# Patient Record
Sex: Female | Born: 1985 | Race: Black or African American | Hispanic: No | Marital: Single | Smoking: Never smoker
Health system: Southern US, Community
[De-identification: ages and names within clinical notes are randomized; demographics above are authoritative.]

## PROBLEM LIST (undated history)

## (undated) DIAGNOSIS — IMO0002 Reserved for concepts with insufficient information to code with codable children: Secondary | ICD-10-CM

## (undated) DIAGNOSIS — R87619 Unspecified abnormal cytological findings in specimens from cervix uteri: Secondary | ICD-10-CM

## (undated) HISTORY — DX: Unspecified abnormal cytological findings in specimens from cervix uteri: R87.619

## (undated) HISTORY — DX: Reserved for concepts with insufficient information to code with codable children: IMO0002

---
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
## 2005-10-26 IMAGING — US US ABDOMEN COMPLETE
1 series · 14 of 25 positions shown · non-contrast
Comparison: none

CLINICAL DATA: Abdominal pain.
 ABDOMEN ULTRASOUND:
TECHNIQUE: Complete abdominal ultrasound examination was performed including evaluation of the liver, gallbladder, bile ducts, pancreas, kidneys, spleen, IVC, and abdominal aorta.
 No prior studies.

[Series 1: unknown · 0.28mm/px · 14 of 55 slices shown]
[im 1/55]
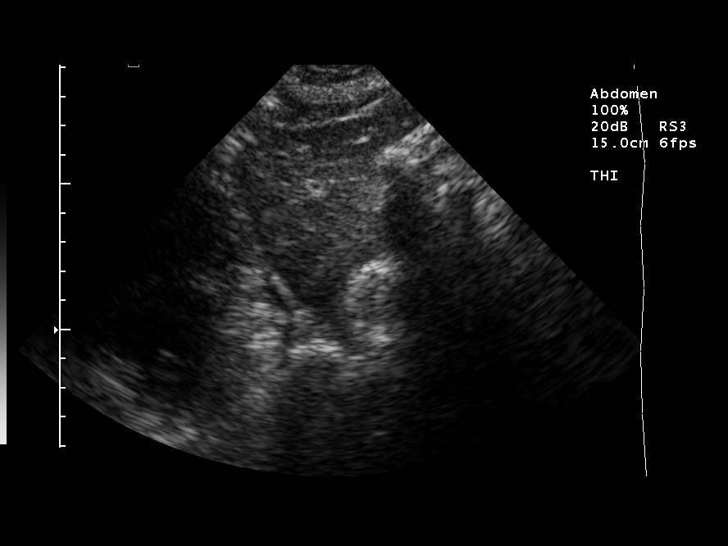
[im 5/55]
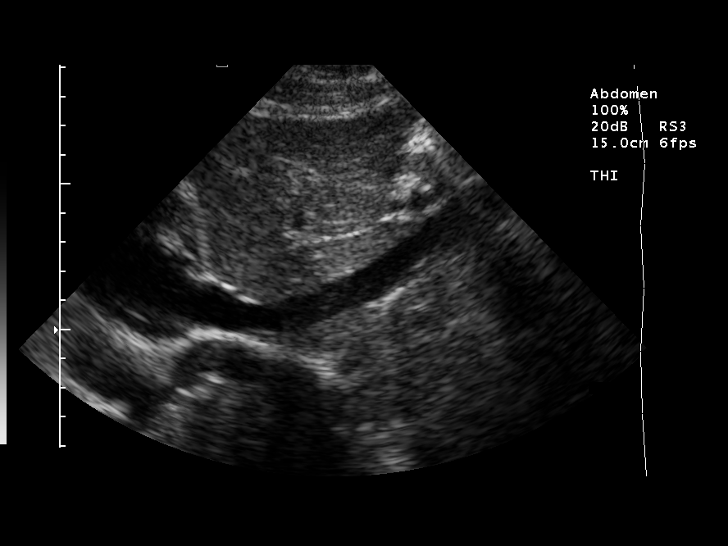
[im 10/55]
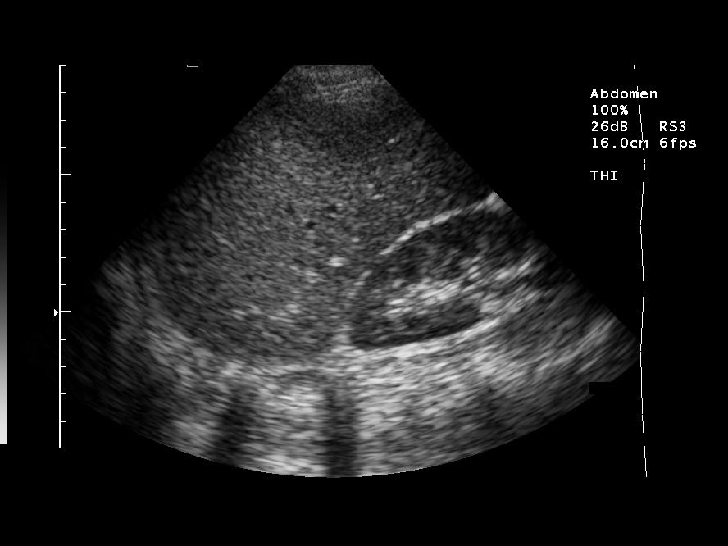
[im 14/55]
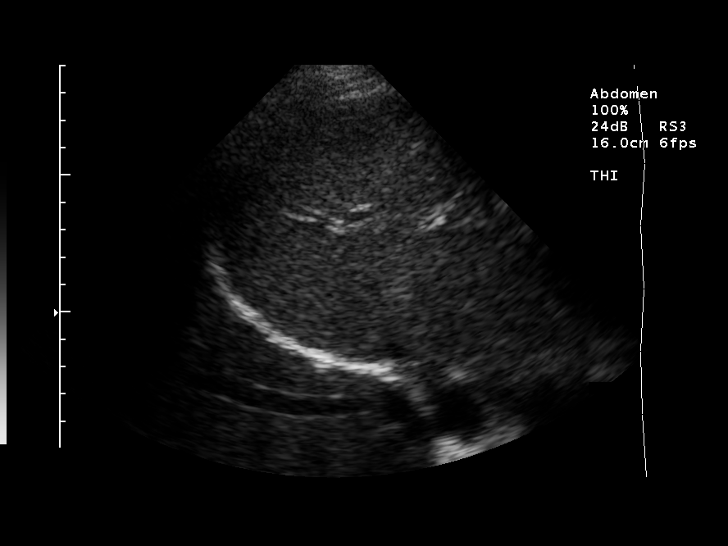
[im 19/55]
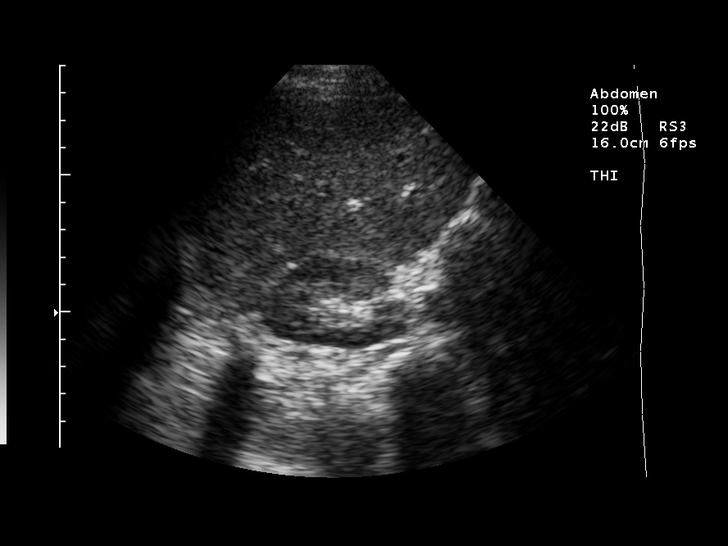
[im 21/55]
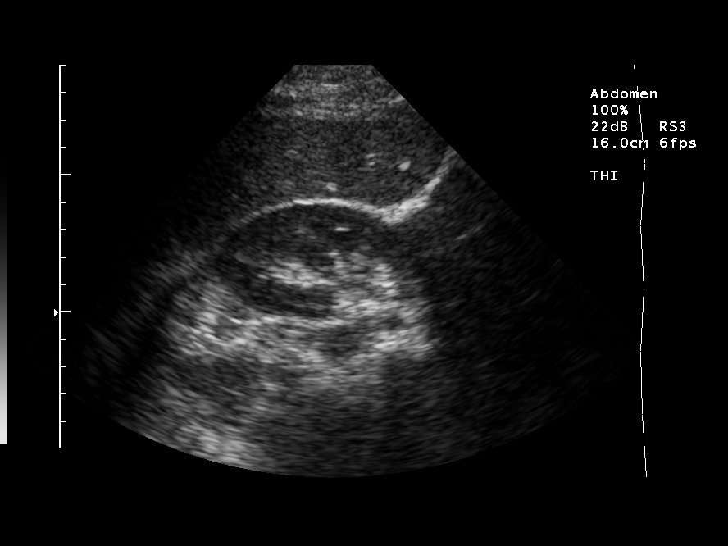
[im 25/55]
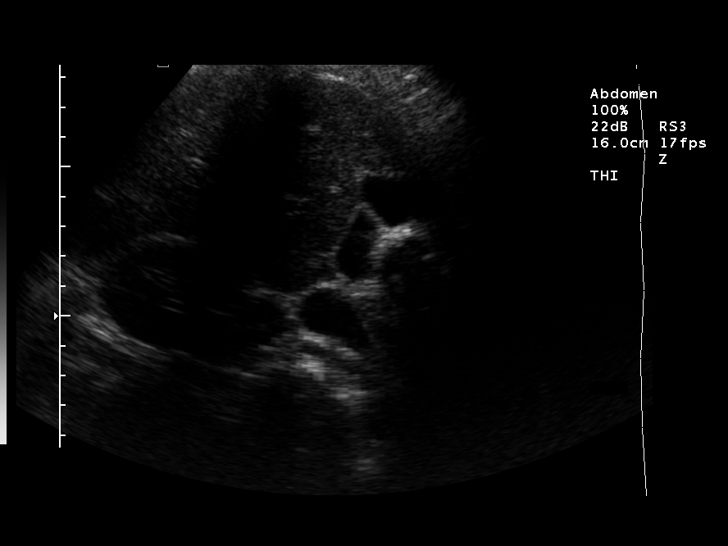
[im 30/55]
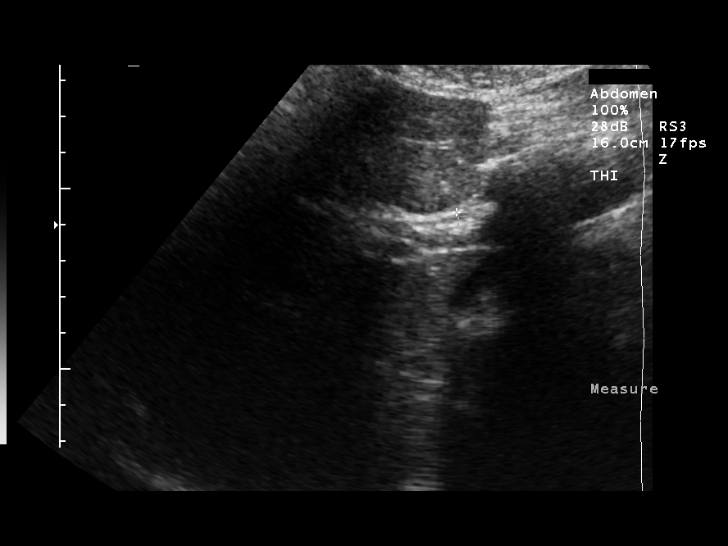
[im 34/55]
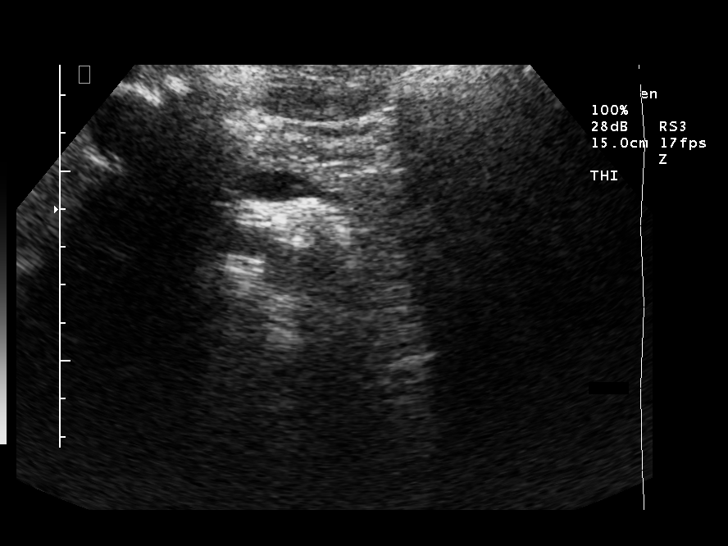
[im 37/55]
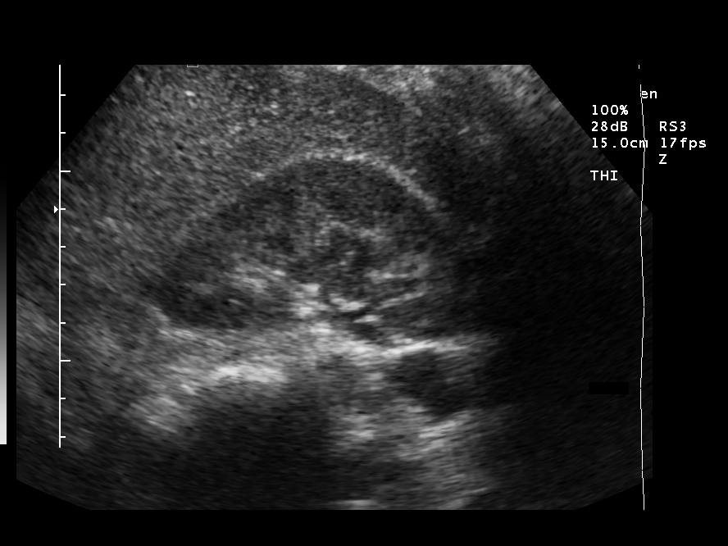
[im 41/55]
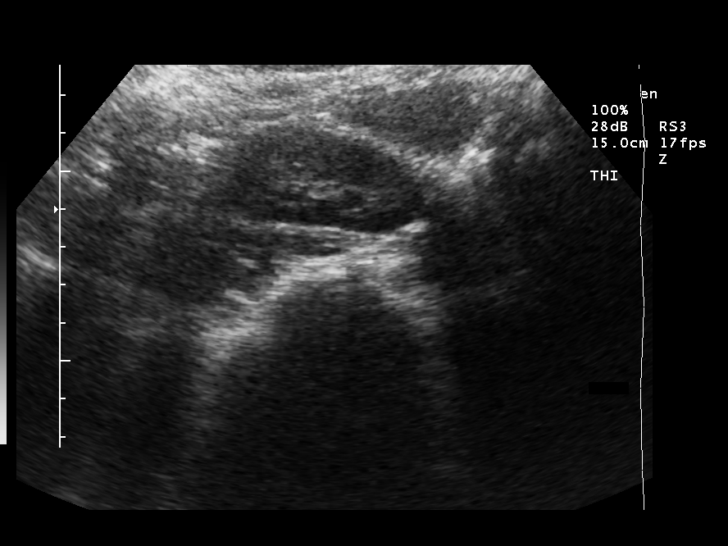
[im 46/55]
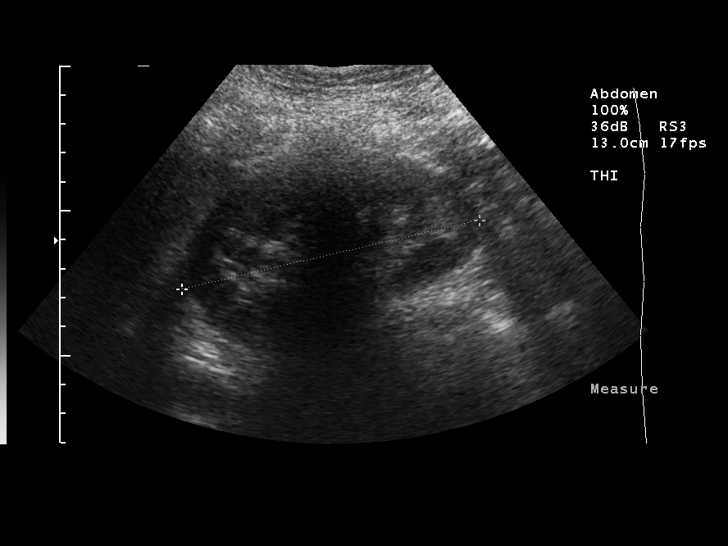
[im 50/55]
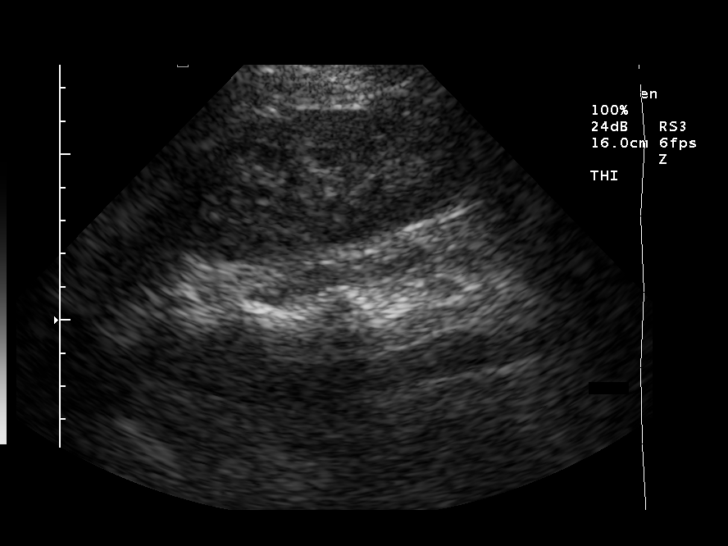
[im 55/55]
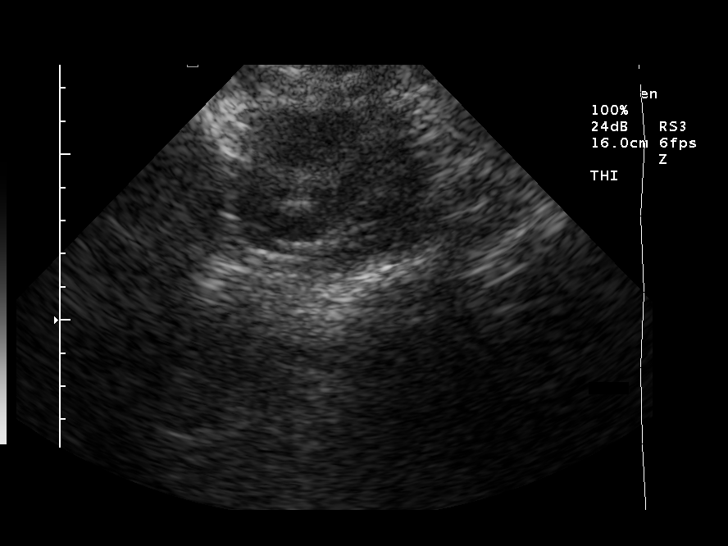

[14 of 25 positions shown; findings below may reference images not displayed]

FINDINGS: There is no evidence of gallstones or biliary ductal dilatation.  The liver is within normal limits in echogenicity, and no focal liver lesions are seen.  The visualized portions of the IVC and pancreas are unremarkable. The pancreatic tail could not be visualized due to overlying bowel gas.
 There is no evidence of splenomegaly.  The kidneys are unremarkable, and there is no evidence of hydronephrosis.  The abdominal aorta is non-dilated. CBD is 2.5 mm. Right kidney is 10.2 cm, and the left kidney is 10.6 cm.
IMPRESSION: 1.  Negative abdominal ultrasound.
 2.  The pancreatic tail could not be visualized due to overlying bowel gas.

## 2006-07-22 ENCOUNTER — Emergency Department (HOSPITAL_COMMUNITY): Admission: EM | Admit: 2006-07-22 | Discharge: 2006-07-22 | Payer: Self-pay | Admitting: Emergency Medicine

## 2006-08-01 ENCOUNTER — Emergency Department (HOSPITAL_COMMUNITY): Admission: EM | Admit: 2006-08-01 | Discharge: 2006-08-01 | Payer: Self-pay | Admitting: Emergency Medicine

## 2007-02-24 ENCOUNTER — Inpatient Hospital Stay (HOSPITAL_COMMUNITY): Admission: AD | Admit: 2007-02-24 | Discharge: 2007-02-24 | Payer: Self-pay | Admitting: Obstetrics & Gynecology

## 2007-10-31 ENCOUNTER — Emergency Department (HOSPITAL_COMMUNITY): Admission: EM | Admit: 2007-10-31 | Discharge: 2007-10-31 | Payer: Self-pay | Admitting: Emergency Medicine

## 2008-03-27 IMAGING — US US OB COMP LESS 14 WK
1 series · 14 of 28 positions shown · non-contrast
Comparison: none

OBSTETRICAL ULTRASOUND:

 This ultrasound exam was performed in the [HOSPITAL] Ultrasound Department.  The OB US report was generated in the AS system, and faxed to the ordering physician.  This report is also available in [REDACTED] PACS.

[Series 1: us ob comp less 14 wk · 0.30mm/px · 14 of 31 slices shown]
[im 2/31]
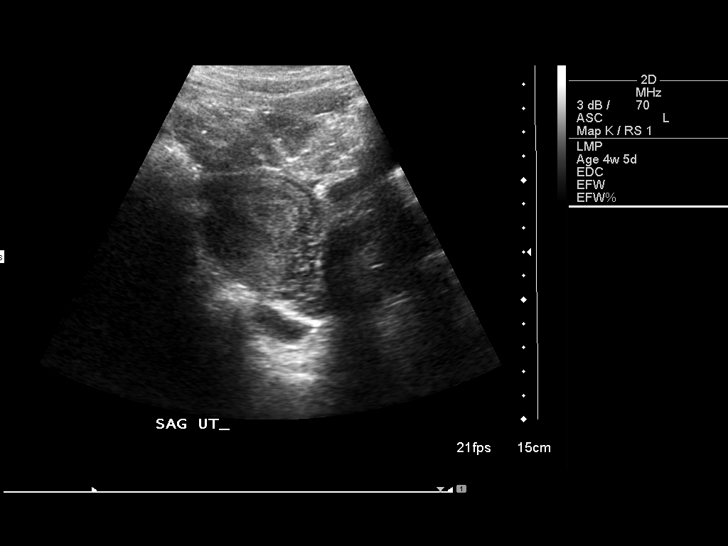
[im 4/31]
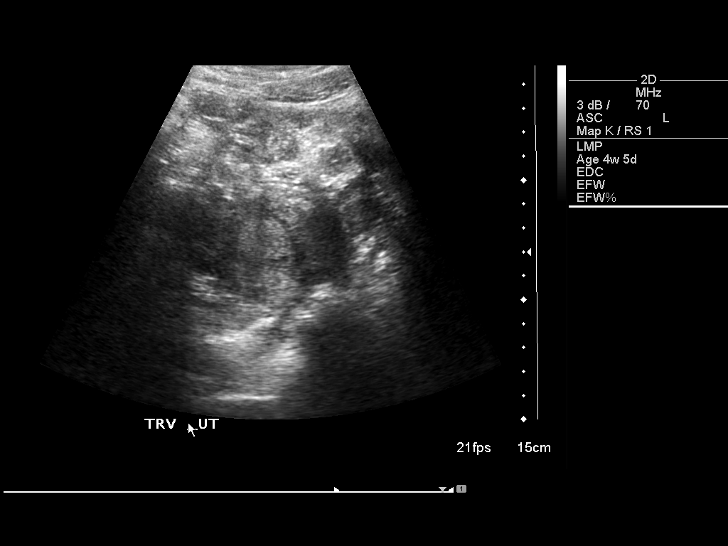
[im 6/31]
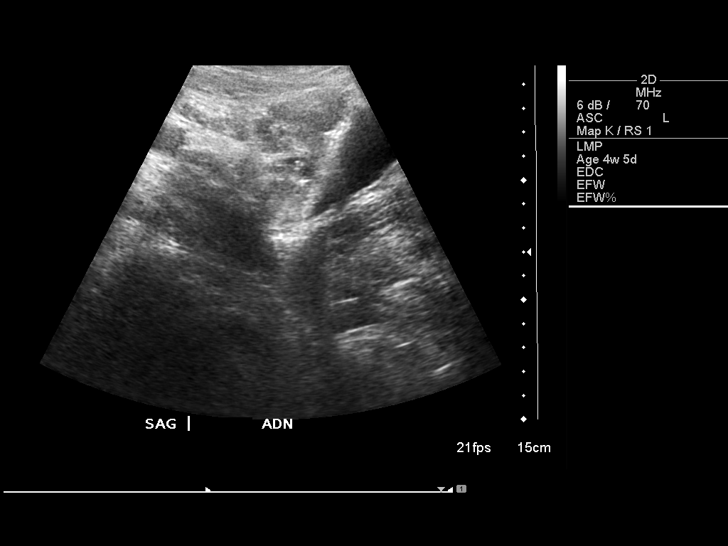
[im 8/31]
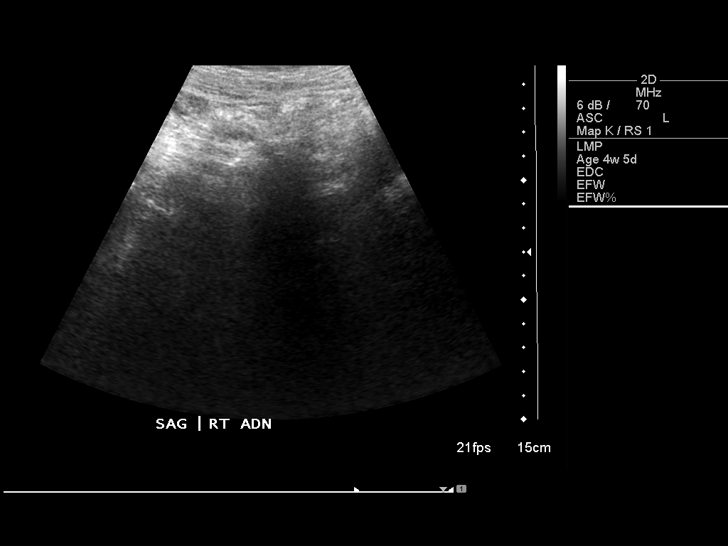
[im 11/31]
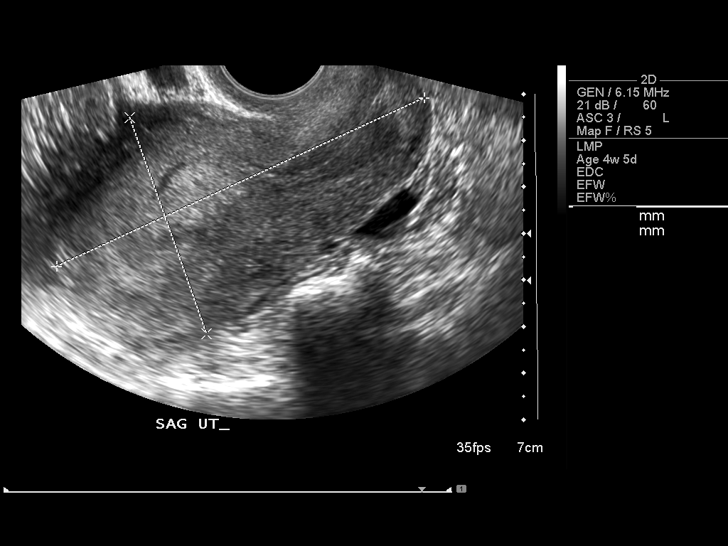
[im 13/31]
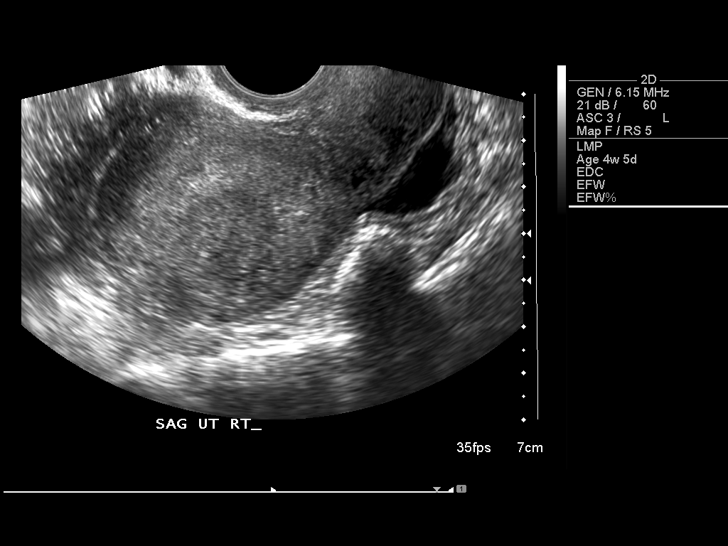
[im 15/31]
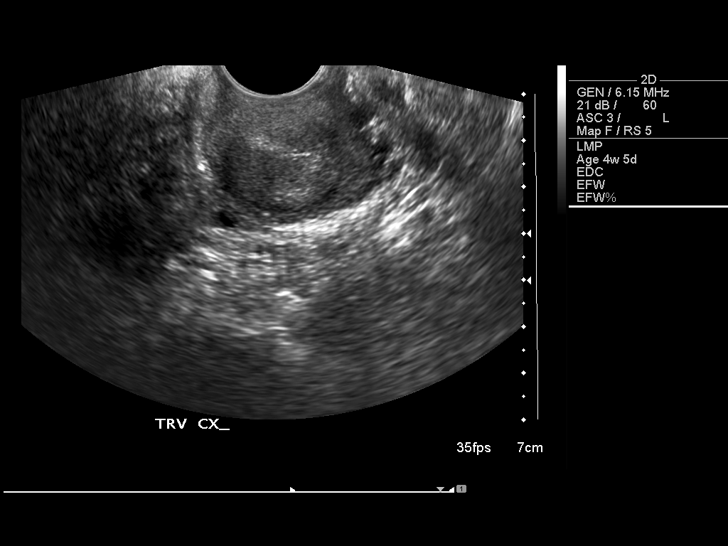
[im 17/31]
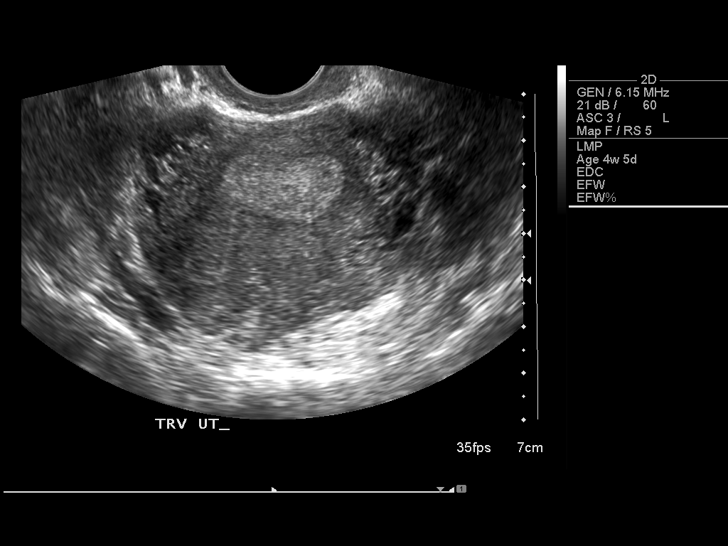
[im 19/31]
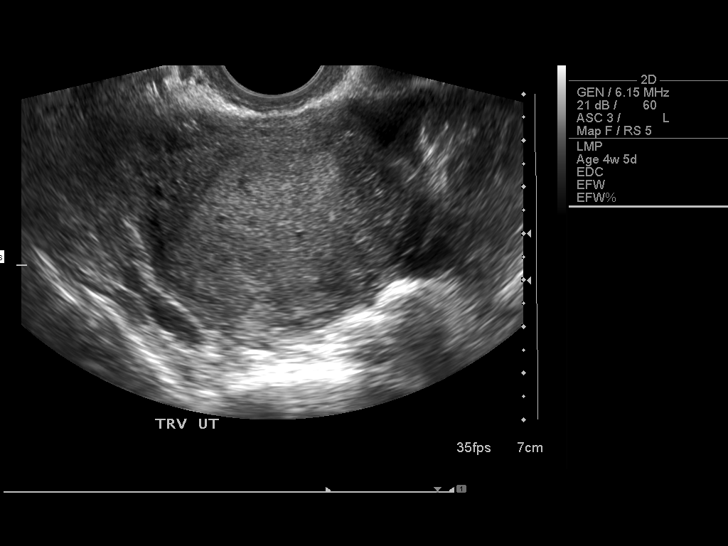
[im 22/31]
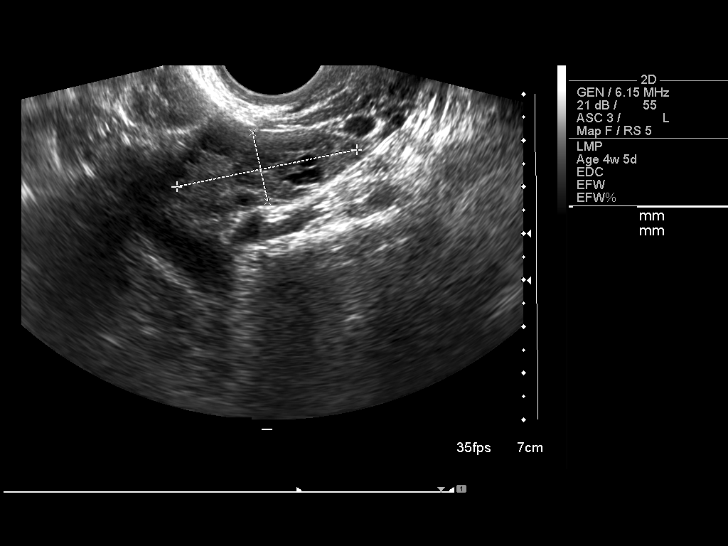
[im 24/31]
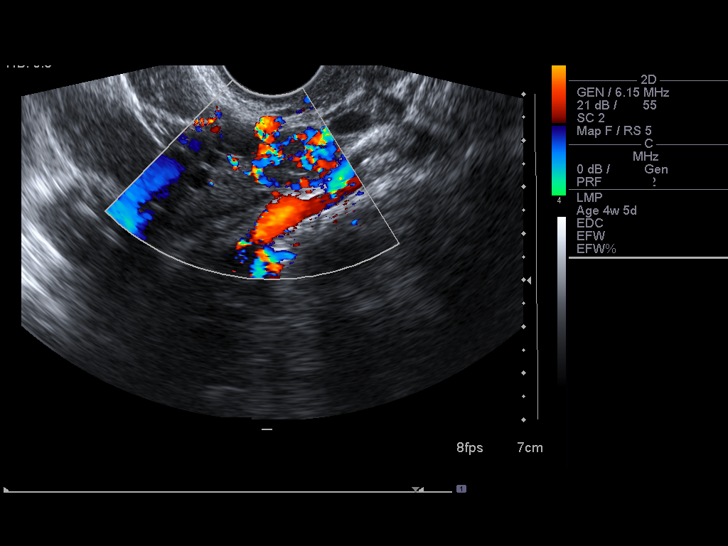
[im 26/31]
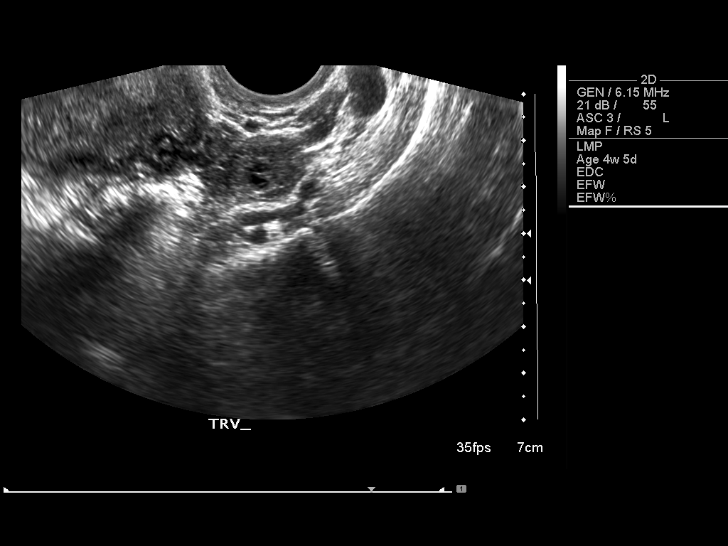
[im 28/31]
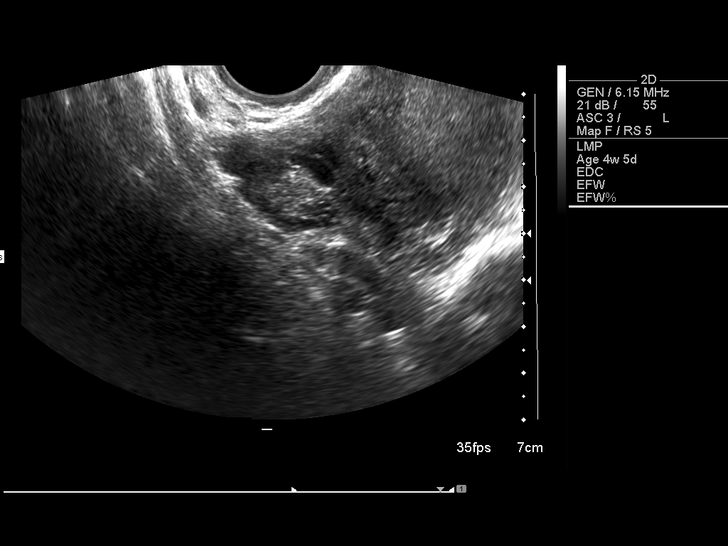
[im 31/31]
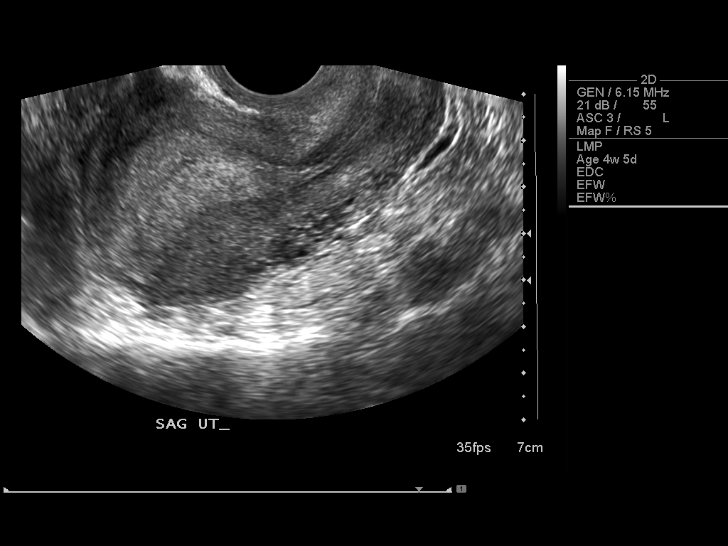

[14 of 28 positions shown; findings below may reference images not displayed]

IMPRESSION: See AS Obstetric US report.

## 2008-04-27 ENCOUNTER — Emergency Department (HOSPITAL_COMMUNITY): Admission: EM | Admit: 2008-04-27 | Discharge: 2008-04-27 | Payer: Self-pay | Admitting: Emergency Medicine

## 2009-03-20 ENCOUNTER — Emergency Department (HOSPITAL_COMMUNITY): Admission: EM | Admit: 2009-03-20 | Discharge: 2009-03-20 | Payer: Self-pay | Admitting: Family Medicine

## 2009-08-08 ENCOUNTER — Emergency Department (HOSPITAL_COMMUNITY): Admission: EM | Admit: 2009-08-08 | Discharge: 2009-08-08 | Payer: Self-pay | Admitting: Family Medicine

## 2010-01-15 ENCOUNTER — Emergency Department (HOSPITAL_COMMUNITY): Admission: EM | Admit: 2010-01-15 | Discharge: 2010-01-15 | Payer: Self-pay | Admitting: Family Medicine

## 2010-06-03 LAB — POCT INFECTIOUS MONO SCREEN: Mono Screen: NEGATIVE

## 2010-06-22 LAB — POCT URINALYSIS DIP (DEVICE)
Bilirubin Urine: NEGATIVE
Protein, ur: 30 mg/dL — AB
Urobilinogen, UA: 1 mg/dL (ref 0.0–1.0)

## 2010-06-22 LAB — WET PREP, GENITAL: Trich, Wet Prep: NONE SEEN

## 2010-06-22 LAB — POCT PREGNANCY, URINE: Preg Test, Ur: NEGATIVE

## 2010-07-07 LAB — POCT URINALYSIS DIP (DEVICE)
Bilirubin Urine: NEGATIVE
Glucose, UA: NEGATIVE mg/dL
Nitrite: NEGATIVE
Protein, ur: 30 mg/dL — AB
Urobilinogen, UA: 1 mg/dL (ref 0.0–1.0)
pH: 5.5 (ref 5.0–8.0)

## 2010-07-07 LAB — GC/CHLAMYDIA PROBE AMP, GENITAL
Chlamydia, DNA Probe: NEGATIVE
GC Probe Amp, Genital: NEGATIVE

## 2010-08-07 NOTE — H&P (Signed)
NAME:  Chloe Black, Chloe Black                       ACCOUNT NO.:  [id number]   MEDICAL RECORD NO.:  [id number]                   PATIENT TYPE:  INP   LOCATION:  9165                                 FACILITY:  [facility]   PHYSICIAN:  Janine Limbo, M.D.            DATE OF BIRTH:  [birth year]   DATE OF ADMISSION:  04/26/2003  DATE OF DISCHARGE:                                HISTORY & PHYSICAL   HISTORY OF PRESENT ILLNESS:  Chloe Black is a 25 year old gravida 1, para 0  at 39-3/7ths weeks who presents with spontaneous rupture of membranes at  approximately 1:15 A.M. with clear fluid noted and mild uterine  contractions.  She had been 2 cm in the office.  She denies headache, visual  symptoms and epigastric pain.   This pregnancy has been remarkable for:  1. Late to care with first visit at [facility] at 35 weeks.  The     patient had been seen at the maternity admissions unit on December 17th     for initial diagnosis of pregnancy.  2. Positive Chlamydia on December 17th with negative test of cure in our     office on January 05th.  3. Increased weight gain the lat two to three weeks with a negative 24-hour     urine protein done.  4. History of irregular cycles.  5. History of asthma.   PRENATAL LABORATORY DATA:  Blood type is AB positive, Rh antibody negative.  VDRL nonreactive.  Rubella titer positive.  Hepatitis B surface antigen is  negative.  Sickle cell test negative.  GC and Chlamydia cultures done on  December 17 were negative gonorrhea and positive Chlamydia.  Follow up test  of cure on January 05th showed negative GC and Chlamydia.  Group B Strep  culture was negative on March 08, 2003.  AFP was not done secondary to  late to care.  Glucola was done and was normal.  The patient also had a  urine drug screen done on March 08, 2003 that was negative.  EDC of  April 30, 2003 was established by ultrasound on March 07, 2004.  Hemoglobin upon entry into care was  11.2.   HISTORY OF PRESENT PREGNANCY:  The patient entered care at 35 weeks.  She  had been seen at the maternity admissions unit on March 08, 2003 with a  positive pregnancy test.  She had an evaluation in maternity admissions that  day at which time she was diagnosed with Chlamydia.  She also had a negative  beta Strep.  She had an ultrasound on December 17th that showed vertex  presentation, estimated fetal weight of 2,039 grams.  There was no previa  noted.  Weight at that time was in the 75th to 90th percentile and fluid was  normal.  She had a visit at [facility] on January 05th at which  time test of cure was done that was  negative.  She had her Glucola that day  and her cervix was closed that day.  Pap was normal.  At 37 weeks her cervix  was 1, 50%, vertex to -2.  At approximately 38 weeks she began to have more  swelling in her legs and feet.  She had elevated urine protein on a voided  specimen, but normal blood pressure.  She also 1-2+ edema in the lower  extremities.  A 24-hour urine was begun and that showed normal findings.  Her cervix at that time was 2, 70%, vertex and -2.   PAST OBSTETRICAL HISTORY:  The patient is a primigravida.   PAST MEDICAL HISTORY:  The patient reports the usual childhood illnesses.  She does have a history of irregular cycles.  She has a history of asthma  for which she uses no routine medications at present.  She did have an  asthma attack as a child and was hospitalized.   ALLERGIES:  The patient has no known medication allergies.   FAMILY HISTORY:  Maternal grandmother had hypertension and maternal  grandmother had diabetes.   GENETIC HISTORY:  Genetic history is unremarkable.   SOCIAL HISTORY:  The patient is 25 years old.  She lives with her family.  Her sister is also a previous patient of the practice.  She has been  followed by the Physician's Service at [facility].  She denies any  alcohol, tobacco or drug use  during this pregnancy.  The patient is an 11th  grade student.  Her partner is Milus Height.  He has been intermittently  involved.  He is not a Consulting civil engineer; he completed the 11th grade.   PHYSICAL EXAMINATION:  VITAL SIGNS:  Blood pressures 141/90, 134/86 and  131/82.  Temperature 99.3.  Other vital signs are stable.  HEENT:  Within normal limits.  LUNGS:  Bilateral breath sounds are clear.  HEART:  Regular rate and rhythm without murmur.  BREASTS:  Breasts are soft and nontender.  ABDOMEN:  Fundal height is approximately 39 cm.  Estimated fetal weight is 7-  8 pounds.  Uterine contractions are every three to five minutes, mild and  somewhat irregular in quality and frequency,  VAGINAL EXAMINATION:  Speculum exam reveals positive pooling and positive  nitrazine.  Cervix is 2-3 cm, 75%, vertex at a -1 station with positive  clear fluid noted.  Fetal heart rate is overall reassuring with a negative  spontaneous CST in one segment.  There were two early decels noted on  initial tracing and one mild variable.  EXTREMITIES:  Deep tendon reflexes are 1-2+ without clonus.  There is 1+  edema noted in the lower extremities.   IMPRESSION:  1. Intrauterine pregnancy at 39-3/7ths weeks.  2. Spontaneous rupture of membranes with early labor.   PLAN:  1. Admit to birthing suite per consult with Dr. Stefano Gaul, attending     physician.  2. Routine physician orders.  3. Plan observation at present for labor advancement.     Renaldo Reel Emilee Hero, C.N.M.                   Janine Limbo, M.D.    Leeanne Mannan  D:  04/26/2003  T:  04/26/2003  Job:  [id number]

## 2010-08-28 ENCOUNTER — Inpatient Hospital Stay (INDEPENDENT_AMBULATORY_CARE_PROVIDER_SITE_OTHER)
Admission: RE | Admit: 2010-08-28 | Discharge: 2010-08-28 | Disposition: A | Discharge status: Home / Self Care | Payer: Self-pay | Source: Ambulatory Visit | Attending: Emergency Medicine | Admitting: Emergency Medicine

## 2010-08-28 DIAGNOSIS — J020 Streptococcal pharyngitis: Secondary | ICD-10-CM

## 2010-08-28 DIAGNOSIS — J039 Acute tonsillitis, unspecified: Secondary | ICD-10-CM

## 2010-08-28 LAB — POCT RAPID STREP A: Streptococcus, Group A Screen (Direct): POSITIVE — AB

## 2010-12-28 LAB — URINALYSIS, ROUTINE W REFLEX MICROSCOPIC
Bilirubin Urine: NEGATIVE
Nitrite: NEGATIVE
Protein, ur: NEGATIVE
Urobilinogen, UA: 1
pH: 6.5

## 2010-12-28 LAB — HCG, QUANTITATIVE, PREGNANCY: hCG, Beta Chain, Quant, S: 47 — ABNORMAL HIGH

## 2010-12-28 LAB — WET PREP, GENITAL: Trich, Wet Prep: NONE SEEN

## 2010-12-28 LAB — ABO/RH: ABO/RH(D): AB POS

## 2010-12-28 LAB — GC/CHLAMYDIA PROBE AMP, GENITAL: GC Probe Amp, Genital: NEGATIVE

## 2012-02-18 ENCOUNTER — Emergency Department (HOSPITAL_COMMUNITY)
Admission: EM | Admit: 2012-02-18 | Discharge: 2012-02-18 | Disposition: A | Discharge status: Home / Self Care | Payer: Self-pay | Attending: Emergency Medicine | Admitting: Emergency Medicine

## 2012-02-18 ENCOUNTER — Encounter (HOSPITAL_COMMUNITY): Payer: Self-pay | Admitting: *Deleted

## 2012-02-18 DIAGNOSIS — R0982 Postnasal drip: Secondary | ICD-10-CM | POA: Insufficient documentation

## 2012-02-18 DIAGNOSIS — J029 Acute pharyngitis, unspecified: Secondary | ICD-10-CM | POA: Insufficient documentation

## 2012-02-18 LAB — RAPID STREP SCREEN (MED CTR MEBANE ONLY): Streptococcus, Group A Screen (Direct): NEGATIVE

## 2012-02-18 MED ORDER — IBUPROFEN 800 MG PO TABS: 800.0000 mg | ORAL_TABLET | Freq: Three times a day (TID) | ORAL | Status: DC | PRN

## 2012-02-18 NOTE — ED Provider Notes (Signed)
Medical screening examination/treatment/procedure(s) were performed by non-physician practitioner and as supervising physician I was immediately available for consultation/collaboration.   Lyanne Co, MD 02/18/12 1227

## 2012-02-18 NOTE — ED Provider Notes (Signed)
History     CSN: [id number]  Arrival date & time 02/18/12  4098   First MD Initiated Contact with Patient 02/18/12 [phone]      Chief Complaint  Patient presents with  . Sore Throat    (Consider location/radiation/quality/duration/timing/severity/associated sxs/prior treatment) HPI Comments: Patient reports that her sister was diagnosed with strep throat two days ago.  After she received this news, her throat started to feel scratchy and painful.  Pain is progressively worsening.  Is also worse at night.  She has taken nothing for her symptoms.  Notes that she and her sister shared a drink last week and she is worried she was exposed.  She does have postnasal drip.  Denies fevers, nasal congestion/rhinorrhea, cough.  Pt is eating and drinking well.  Denies difficulty breathing or swallowing.   Patient is a 26 y.o. female presenting with pharyngitis. The history is provided by the patient.  Sore Throat Associated symptoms include a sore throat. Pertinent negatives include no chills, congestion, coughing, fever, myalgias or neck pain.    No past medical history on file.  No past surgical history on file.  No family history on file.  History  Substance Use Topics  . Smoking status: Never Smoker   . Smokeless tobacco: Not on file  . Alcohol Use: No    OB History    Grav Para Term Preterm Abortions TAB SAB Ect Mult Living                  Review of Systems  Constitutional: Negative for fever and chills.  HENT: Positive for sore throat and postnasal drip. Negative for ear pain, congestion, rhinorrhea, trouble swallowing, neck pain and sinus pressure.   Respiratory: Negative for cough and shortness of breath.   Musculoskeletal: Negative for myalgias.    Allergies  Review of patient's allergies indicates no known allergies.  Home Medications   Current Outpatient Rx  Name  Route  Sig  Dispense  Refill  . IBUPROFEN 800 MG PO TABS   Oral   Take 1 tablet (800 mg total) by  mouth every 8 (eight) hours as needed for pain.   21 tablet   0     BP 137/76  Pulse 116  Temp 98.5 F (36.9 C) (Oral)  Resp 16  Ht 5\' 5"  (1.651 m)  Wt 187 lb (84.823 kg)  BMI 31.12 kg/m2  SpO2 100%  Physical Exam  Nursing note and vitals reviewed. Constitutional: She appears well-developed and well-nourished. No distress.  HENT:  Head: Normocephalic and atraumatic.  Mouth/Throat: Uvula is midline. Mucous membranes are not dry. No uvula swelling. Posterior oropharyngeal erythema present. No oropharyngeal exudate, posterior oropharyngeal edema or tonsillar abscesses.  Eyes: Conjunctivae normal are normal.  Neck: Normal range of motion. Neck supple. No tracheal tenderness present. No tracheal deviation present.  Cardiovascular: Normal rate and regular rhythm.   Pulmonary/Chest: Effort normal and breath sounds normal. No stridor. No respiratory distress. She has no wheezes. She has no rales.  Lymphadenopathy:    She has no cervical adenopathy.  Neurological: She is alert.  Skin: She is not diaphoretic.    ED Course  Procedures (including critical care time)   Labs Reviewed  RAPID STREP SCREEN  STREP A DNA PROBE   No results found.   1. Pharyngitis     MDM  Pt with sore throat x 3 days.  Pt has mild erythema of pharynx but without edema or exudate, no cervical lymphadenopathy, no fever.  Per  centor criteria, no need for culture.  However, patient does have an exposure to close contact with strep, will send for dna probe.  Discussed all results with patient.  Pt given return precautions.  Pt verbalizes understanding and agrees with plan.          [place], [place] 02/18/12 1014

## 2012-02-18 NOTE — ED Notes (Signed)
C/o sore throat.

## 2012-02-19 LAB — STREP A DNA PROBE: Group A Strep Probe: NEGATIVE

## 2012-06-17 ENCOUNTER — Emergency Department (HOSPITAL_COMMUNITY)
Admission: EM | Admit: 2012-06-17 | Discharge: 2012-06-17 | Disposition: A | Discharge status: Home / Self Care | Payer: Self-pay | Attending: Emergency Medicine | Admitting: Emergency Medicine

## 2012-06-17 ENCOUNTER — Encounter (HOSPITAL_COMMUNITY): Payer: Self-pay | Admitting: Cardiology

## 2012-06-17 DIAGNOSIS — Z3202 Encounter for pregnancy test, result negative: Secondary | ICD-10-CM | POA: Insufficient documentation

## 2012-06-17 DIAGNOSIS — R197 Diarrhea, unspecified: Secondary | ICD-10-CM | POA: Insufficient documentation

## 2012-06-17 DIAGNOSIS — R1110 Vomiting, unspecified: Secondary | ICD-10-CM | POA: Insufficient documentation

## 2012-06-17 DIAGNOSIS — K529 Noninfective gastroenteritis and colitis, unspecified: Secondary | ICD-10-CM

## 2012-06-17 DIAGNOSIS — K5289 Other specified noninfective gastroenteritis and colitis: Secondary | ICD-10-CM | POA: Insufficient documentation

## 2012-06-17 LAB — POCT I-STAT, CHEM 8
BUN: 13 mg/dL (ref 6–23)
Creatinine, Ser: 0.7 mg/dL (ref 0.50–1.10)
Glucose, Bld: 111 mg/dL — ABNORMAL HIGH (ref 70–99)
Hemoglobin: 15.3 g/dL — ABNORMAL HIGH (ref 12.0–15.0)
Sodium: 143 mEq/L (ref 135–145)
TCO2: 22 mmol/L (ref 0–100)

## 2012-06-17 LAB — URINE MICROSCOPIC-ADD ON

## 2012-06-17 LAB — URINALYSIS, ROUTINE W REFLEX MICROSCOPIC
Bilirubin Urine: NEGATIVE
Glucose, UA: NEGATIVE mg/dL
Hgb urine dipstick: NEGATIVE
Ketones, ur: NEGATIVE mg/dL
Protein, ur: NEGATIVE mg/dL
Urobilinogen, UA: 1 mg/dL (ref 0.0–1.0)

## 2012-06-17 MED ORDER — LOPERAMIDE HCL 2 MG PO CAPS
2.0000 mg | ORAL_CAPSULE | ORAL | Status: DC | PRN
  Administered 2012-06-17: 2 mg via ORAL
  Filled 2012-06-17: qty 1

## 2012-06-17 MED ORDER — SODIUM CHLORIDE 0.9 % IV SOLN
INTRAVENOUS | Status: DC
  Administered 2012-06-17: 10:00:00 via INTRAVENOUS

## 2012-06-17 MED ORDER — ONDANSETRON 8 MG PO TBDP: 8.0000 mg | ORAL_TABLET | Freq: Three times a day (TID) | ORAL | Status: DC | PRN

## 2012-06-17 MED ORDER — SODIUM CHLORIDE 0.9 % IV BOLUS (SEPSIS)
1000.0000 mL | Freq: Once | INTRAVENOUS | Status: AC
  Administered 2012-06-17: 1000 mL via INTRAVENOUS

## 2012-06-17 MED ORDER — DIPHENOXYLATE-ATROPINE 2.5-0.025 MG PO TABS: 1.0000 | ORAL_TABLET | Freq: Four times a day (QID) | ORAL | Status: DC | PRN

## 2012-06-17 MED ORDER — ONDANSETRON HCL 4 MG/2ML IJ SOLN
4.0000 mg | Freq: Once | INTRAMUSCULAR | Status: AC
  Administered 2012-06-17: 4 mg via INTRAVENOUS
  Filled 2012-06-17: qty 2

## 2012-06-17 NOTE — ED Provider Notes (Signed)
History     CSN: [id number]  Arrival date & time 06/17/12  3244   First MD Initiated Contact with Patient 06/17/12 [phone]      Chief Complaint  Patient presents with  . Abdominal Pain    (Consider location/radiation/quality/duration/timing/severity/associated sxs/prior treatment) Patient is a 27 y.o. female presenting with abdominal pain. The history is provided by the patient.  Abdominal Pain  patient here complaining of sudden onset of artery diarrhea and nonbilious vomiting approximately 6 hours prior to arrival. Patient had a possible bad food exposure. Denies any fever or chills. Abdominal pain is crampy and diffuse and occurs just before she has diarrhea. Attempted to take Pepto-Bismol without relief. Denies any urinary symptoms. No vaginal bleeding or discharge. Symptoms have been constant. And nothing makes them better or worse  No past medical history on file.  No past surgical history on file.  No family history on file.  History  Substance Use Topics  . Smoking status: Never Smoker   . Smokeless tobacco: Not on file  . Alcohol Use: No    OB History   Grav Para Term Preterm Abortions TAB SAB Ect Mult Living                  Review of Systems  Gastrointestinal: Positive for abdominal pain.  All other systems reviewed and are negative.    Allergies  Review of patient's allergies indicates no known allergies.  Home Medications   Current Outpatient Rx  Name  Route  Sig  Dispense  Refill  . ibuprofen (ADVIL,MOTRIN) 800 MG tablet   Oral   Take 1 tablet (800 mg total) by mouth every 8 (eight) hours as needed for pain.   21 tablet   0     There were no vitals taken for this visit.  Physical Exam  Nursing note and vitals reviewed. Constitutional: She is oriented to person, place, and time. She appears well-developed and well-nourished.  Non-toxic appearance. No distress.  HENT:  Head: Normocephalic and atraumatic.  Eyes: Conjunctivae, EOM and lids are  normal. Pupils are equal, round, and reactive to light.  Neck: Normal range of motion. Neck supple. No tracheal deviation present. No mass present.  Cardiovascular: Normal rate, regular rhythm and normal heart sounds.  Exam reveals no gallop.   No murmur heard. Pulmonary/Chest: Effort normal and breath sounds normal. No stridor. No respiratory distress. She has no decreased breath sounds. She has no wheezes. She has no rhonchi. She has no rales.  Abdominal: Soft. Normal appearance and bowel sounds are normal. She exhibits no distension. There is no tenderness. There is no rebound and no CVA tenderness.  Musculoskeletal: Normal range of motion. She exhibits no edema and no tenderness.  Neurological: She is alert and oriented to person, place, and time. She has normal strength. No cranial nerve deficit or sensory deficit. GCS eye subscore is 4. GCS verbal subscore is 5. GCS motor subscore is 6.  Skin: Skin is warm and dry. No abrasion and no rash noted.  Psychiatric: She has a normal mood and affect. Her speech is normal and behavior is normal.    ED Course  Procedures (including critical care time)  Labs Reviewed  URINALYSIS, ROUTINE W REFLEX MICROSCOPIC   No results found.   No diagnosis found.    MDM  Pt given iv fluids and meds for emesis and diarrhea--suspect food poisioning, no concern for intra-abdominal process, stable for d/c        Toy Baker,  MD 06/17/12 1101

## 2012-06-17 NOTE — ED Notes (Signed)
Pt reports she woke up this morning with generalized abd. States she had a loose BM and an episode of vomiting after taking some imodium. Denies any urinary symptoms.

## 2012-09-06 ENCOUNTER — Encounter: Payer: Self-pay | Admitting: Obstetrics and Gynecology

## 2012-10-09 ENCOUNTER — Ambulatory Visit (INDEPENDENT_AMBULATORY_CARE_PROVIDER_SITE_OTHER): Payer: Self-pay | Admitting: Obstetrics and Gynecology

## 2012-10-09 ENCOUNTER — Other Ambulatory Visit (HOSPITAL_COMMUNITY)
Admission: RE | Admit: 2012-10-09 | Discharge: 2012-10-09 | Disposition: A | Discharge status: Home / Self Care | Payer: Self-pay | Source: Ambulatory Visit | Attending: Obstetrics and Gynecology | Admitting: Obstetrics and Gynecology

## 2012-10-09 VITALS — BP 146/92 | HR 116 | Temp 100.6°F | Ht 63.0 in | Wt 197.0 lb

## 2012-10-09 DIAGNOSIS — Z23 Encounter for immunization: Secondary | ICD-10-CM

## 2012-10-09 DIAGNOSIS — N72 Inflammatory disease of cervix uteri: Secondary | ICD-10-CM | POA: Insufficient documentation

## 2012-10-09 DIAGNOSIS — Z01812 Encounter for preprocedural laboratory examination: Secondary | ICD-10-CM

## 2012-10-09 DIAGNOSIS — IMO0002 Reserved for concepts with insufficient information to code with codable children: Secondary | ICD-10-CM

## 2012-10-09 DIAGNOSIS — N871 Moderate cervical dysplasia: Secondary | ICD-10-CM | POA: Insufficient documentation

## 2012-10-09 DIAGNOSIS — R87612 Low grade squamous intraepithelial lesion on cytologic smear of cervix (LGSIL): Secondary | ICD-10-CM

## 2012-10-09 NOTE — Addendum Note (Signed)
Addended by: Faythe Casa on: 10/09/2012 03:44 PM   Modules accepted: Orders

## 2012-10-09 NOTE — Progress Notes (Signed)
Patient ID: Chloe Black, female   DOB: 1985-12-13, 27 y.o.   MRN: [id number] Patient with LGSIL on 06/2012 Patient given informed consent, signed copy in the chart, time out was performed.  Placed in lithotomy position. Cervix viewed with speculum and colposcope after application of acetic acid.   Colposcopy adequate?  No. TZ not visualized Acetowhite lesions?yes 4 and 8 o'clock Punctation?nno Mosaicism?  no Abnormal vasculature?  no Biopsies?yes 4 and 8 o'clock ECC?yes  COMMENTS: Patient was given post procedure instructions.  She will return in 2 weeks for results. Patient also desires to start Gardasil vaccine series

## 2012-10-18 ENCOUNTER — Telehealth: Payer: Self-pay | Admitting: Obstetrics and Gynecology

## 2012-10-18 NOTE — Telephone Encounter (Addendum)
Message copied by Toula Moos on Wed Oct 18, 2012  1:40 PM   ------Attempted to call patient. No answer. Left message to RTC for important message. Appt made on 12/01/12 @0815         Message from: Odelia Gage A      Created: Tue Oct 17, 2012  7:24 AM        Sept. 12th @8 :15                  ----- Message -----         From: Drucilla Schmidt Day, RN         Sent: 10/13/2012   8:03 AM           To: [facility] Admin Pool            Please schedule LEEP appt and return message to clinical pool. We will call her. Thanks       ----- Message -----         From: Catalina Antigua, MD         Sent: 10/12/2012   8:19 PM           To: [facility] Clinical Pool            Please inform patient of abnormal cervical biopsy results and need for a LEEP procedure. Please schedule next available LEEP appointment.             ------

## 2012-10-19 NOTE — Telephone Encounter (Signed)
Cassidi called , informed her of results and both appointments and that she will watch LEEP video at results appt.

## 2012-10-24 ENCOUNTER — Encounter: Payer: Self-pay | Admitting: *Deleted

## 2012-10-25 ENCOUNTER — Ambulatory Visit (INDEPENDENT_AMBULATORY_CARE_PROVIDER_SITE_OTHER): Payer: Self-pay | Admitting: Obstetrics and Gynecology

## 2012-10-25 VITALS — BP 150/98 | HR 101 | Ht 64.0 in | Wt 197.6 lb

## 2012-10-25 DIAGNOSIS — Z712 Person consulting for explanation of examination or test findings: Secondary | ICD-10-CM

## 2012-10-25 DIAGNOSIS — R6889 Other general symptoms and signs: Secondary | ICD-10-CM

## 2012-10-25 DIAGNOSIS — IMO0002 Reserved for concepts with insufficient information to code with codable children: Secondary | ICD-10-CM

## 2012-10-25 DIAGNOSIS — Z7189 Other specified counseling: Secondary | ICD-10-CM

## 2012-10-25 NOTE — Progress Notes (Signed)
Patient ID: Chloe Black, female   DOB: 01-15-1986, 27 y.o.   MRN: [id number] 27 yo presenting today to discuss results of 7/21 colposcopy. 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 Excisional biopsy is recommended. Patient to watch LEEP video and return in September for LEEP procedure.

## 2012-12-01 ENCOUNTER — Encounter: Payer: Self-pay | Admitting: Obstetrics and Gynecology

## 2012-12-14 ENCOUNTER — Encounter: Payer: Self-pay | Admitting: Obstetrics and Gynecology

## 2012-12-14 ENCOUNTER — Other Ambulatory Visit (HOSPITAL_COMMUNITY)
Admission: RE | Admit: 2012-12-14 | Discharge: 2012-12-14 | Disposition: A | Discharge status: Home / Self Care | Payer: Self-pay | Source: Ambulatory Visit | Attending: Obstetrics and Gynecology | Admitting: Obstetrics and Gynecology

## 2012-12-14 ENCOUNTER — Ambulatory Visit (INDEPENDENT_AMBULATORY_CARE_PROVIDER_SITE_OTHER): Payer: Self-pay | Admitting: Obstetrics and Gynecology

## 2012-12-14 VITALS — BP 136/88 | HR 106 | Temp 99.9°F | Ht 64.0 in | Wt 199.5 lb

## 2012-12-14 DIAGNOSIS — R6889 Other general symptoms and signs: Secondary | ICD-10-CM

## 2012-12-14 DIAGNOSIS — R87612 Low grade squamous intraepithelial lesion on cytologic smear of cervix (LGSIL): Secondary | ICD-10-CM

## 2012-12-14 DIAGNOSIS — N871 Moderate cervical dysplasia: Secondary | ICD-10-CM | POA: Insufficient documentation

## 2012-12-14 DIAGNOSIS — IMO0002 Reserved for concepts with insufficient information to code with codable children: Secondary | ICD-10-CM

## 2012-12-14 DIAGNOSIS — Z01812 Encounter for preprocedural laboratory examination: Secondary | ICD-10-CM

## 2012-12-14 LAB — POCT PREGNANCY, URINE: Preg Test, Ur: NEGATIVE

## 2012-12-14 NOTE — Progress Notes (Signed)
Patient ID: Chloe Black, female   DOB: Jun 03, 1985, 27 y.o.   MRN: [id number] Patient identified, informed consent obtained, signed copy in chart, time out performed.  Pap smear and colposcopy reviewed.   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  Cervix visualized. Paracervical block placed.  medium size LOOP used to remove cone of cervix using blend of cut and cautery on LEEP machine.  Edges/Base cauterized with Ball.  Monsel's solution used for hemostasis.  Patient tolerated procedure well.  Patient given post procedure instructions.  Follow up in 6 months for repeat pap or as needed.

## 2012-12-14 NOTE — Addendum Note (Signed)
Addended by: Sherre Lain A on: 12/14/2012 04:58 PM   Modules accepted: Orders

## 2013-02-12 ENCOUNTER — Encounter: Payer: Self-pay | Admitting: *Deleted

## 2014-01-21 ENCOUNTER — Encounter: Payer: Self-pay | Admitting: Obstetrics and Gynecology

## 2014-04-04 ENCOUNTER — Encounter (HOSPITAL_COMMUNITY): Payer: Self-pay | Admitting: *Deleted

## 2014-04-04 ENCOUNTER — Emergency Department (HOSPITAL_COMMUNITY)
Admission: EM | Admit: 2014-04-04 | Discharge: 2014-04-04 | Disposition: A | Discharge status: Home / Self Care | Payer: Self-pay | Attending: Emergency Medicine | Admitting: Emergency Medicine

## 2014-04-04 DIAGNOSIS — B9689 Other specified bacterial agents as the cause of diseases classified elsewhere: Secondary | ICD-10-CM

## 2014-04-04 DIAGNOSIS — R590 Localized enlarged lymph nodes: Secondary | ICD-10-CM | POA: Insufficient documentation

## 2014-04-04 DIAGNOSIS — R4702 Dysphasia: Secondary | ICD-10-CM | POA: Insufficient documentation

## 2014-04-04 DIAGNOSIS — J029 Acute pharyngitis, unspecified: Secondary | ICD-10-CM | POA: Insufficient documentation

## 2014-04-04 DIAGNOSIS — J028 Acute pharyngitis due to other specified organisms: Secondary | ICD-10-CM

## 2014-04-04 DIAGNOSIS — Z79899 Other long term (current) drug therapy: Secondary | ICD-10-CM | POA: Insufficient documentation

## 2014-04-04 LAB — COMPREHENSIVE METABOLIC PANEL
ALBUMIN: 4.1 g/dL (ref 3.5–5.2)
ALT: 16 U/L (ref 0–35)
AST: 22 U/L (ref 0–37)
Alkaline Phosphatase: 63 U/L (ref 39–117)
Anion gap: 7 (ref 5–15)
BILIRUBIN TOTAL: 0.3 mg/dL (ref 0.3–1.2)
BUN: 7 mg/dL (ref 6–23)
CHLORIDE: 106 meq/L (ref 96–112)
CO2: 26 mmol/L (ref 19–32)
CREATININE: 0.99 mg/dL (ref 0.50–1.10)
Calcium: 9.3 mg/dL (ref 8.4–10.5)
GFR calc non Af Amer: 77 mL/min — ABNORMAL LOW (ref >90)
GFR, EST AFRICAN AMERICAN: 89 mL/min — AB (ref >90)
GLUCOSE: 129 mg/dL — AB (ref 70–99)
Potassium: 4.3 mmol/L (ref 3.5–5.1)
SODIUM: 139 mmol/L (ref 135–145)
TOTAL PROTEIN: 8 g/dL (ref 6.0–8.3)

## 2014-04-04 LAB — CBC WITH DIFFERENTIAL/PLATELET
BASOS ABS: 0 10*3/uL (ref 0.0–0.1)
Basophils Relative: 0 % (ref 0–1)
EOS ABS: 0 10*3/uL (ref 0.0–0.7)
EOS PCT: 0 % (ref 0–5)
HCT: 41.2 % (ref 36.0–46.0)
HEMOGLOBIN: 14.2 g/dL (ref 12.0–15.0)
LYMPHS ABS: 1.2 10*3/uL (ref 0.7–4.0)
Lymphocytes Relative: 10 % — ABNORMAL LOW (ref 12–46)
MCH: 29.4 pg (ref 26.0–34.0)
MCHC: 34.5 g/dL (ref 30.0–36.0)
MCV: 85.3 fL (ref 78.0–100.0)
MONOS PCT: 8 % (ref 3–12)
Monocytes Absolute: 1 10*3/uL (ref 0.1–1.0)
NEUTROS PCT: 82 % — AB (ref 43–77)
Neutro Abs: 9.6 10*3/uL — ABNORMAL HIGH (ref 1.7–7.7)
Platelets: 292 10*3/uL (ref 150–400)
RBC: 4.83 MIL/uL (ref 3.87–5.11)
RDW: 13.2 % (ref 11.5–15.5)
WBC: 11.8 10*3/uL — ABNORMAL HIGH (ref 4.0–10.5)

## 2014-04-04 LAB — RAPID STREP SCREEN (MED CTR MEBANE ONLY): STREPTOCOCCUS, GROUP A SCREEN (DIRECT): NEGATIVE

## 2014-04-04 LAB — I-STAT CG4 LACTIC ACID, ED: Lactic Acid, Venous: 2.44 mmol/L — ABNORMAL HIGH (ref 0.5–2.2)

## 2014-04-04 MED ORDER — PENICILLIN G BENZATHINE 1200000 UNIT/2ML IM SUSP
1.2000 10*6.[IU] | Freq: Once | INTRAMUSCULAR | Status: AC
  Administered 2014-04-04: 1.2 10*6.[IU] via INTRAMUSCULAR
  Filled 2014-04-04: qty 2

## 2014-04-04 MED ORDER — ACETAMINOPHEN 325 MG PO TABS
650.0000 mg | ORAL_TABLET | Freq: Once | ORAL | Status: AC
  Administered 2014-04-04: 650 mg via ORAL
  Filled 2014-04-04: qty 2

## 2014-04-04 MED ORDER — SODIUM CHLORIDE 0.9 % IV BOLUS (SEPSIS)
1000.0000 mL | Freq: Once | INTRAVENOUS | Status: AC
  Administered 2014-04-04: 1000 mL via INTRAVENOUS

## 2014-04-04 MED ORDER — AMOXICILLIN-POT CLAVULANATE 875-125 MG PO TABS: 1.0000 | ORAL_TABLET | Freq: Two times a day (BID) | ORAL | Status: AC

## 2014-04-04 MED ORDER — DEXAMETHASONE 4 MG PO TABS
6.0000 mg | ORAL_TABLET | Freq: Once | ORAL | Status: AC
  Administered 2014-04-04: 6 mg via ORAL
  Filled 2014-04-04: qty 2

## 2014-04-04 MED ORDER — IBUPROFEN 800 MG PO TABS
800.0000 mg | ORAL_TABLET | Freq: Once | ORAL | Status: AC
  Administered 2014-04-04: 800 mg via ORAL
  Filled 2014-04-04: qty 1

## 2014-04-04 NOTE — ED Provider Notes (Signed)
CSN: [id number]     Arrival date & time 04/04/14  1518 History   First MD Initiated Contact with Patient 04/04/14 1920     Chief Complaint  Patient presents with  . Sore Throat     (Consider location/radiation/quality/duration/timing/severity/associated sxs/prior Treatment) HPI  Chloe Black is a 29 y.o. female presenting with 3 day history of sore throat and dysphasia. Patient able to swallow but states it's painful. She has not taken anything other than ibuprofen for discomfort. Patient has never had pain like this before. She denies any chest pain, shortness of breath, difficulty breathing. No dental pain. Patient reports fevers and no cough. No other URI symptoms. She denies any sick contacts.   Past Medical History  Diagnosis Date  . Abnormal Pap smear    History reviewed. No pertinent past surgical history. History reviewed. No pertinent family history. History  Substance Use Topics  . Smoking status: Never Smoker   . Smokeless tobacco: Not on file  . Alcohol Use: No   OB History    Gravida Para Term Preterm AB TAB SAB Ectopic Multiple Living   Review of Systems 10 Systems reviewed and are negative for acute change except as noted in the HPI.    Allergies  Review of patient's allergies indicates no known allergies.  Home Medications   Prior to Admission medications   Medication Sig Start Date End Date Taking? Authorizing Provider  levonorgestrel-ethinyl estradiol (NORDETTE) 0.15-30 MG-MCG tablet Take 1 tablet by mouth daily.   Yes Historical Provider, MD  amoxicillin-clavulanate (AUGMENTIN) 875-125 MG per tablet Take 1 tablet by mouth every 12 (twelve) hours. 04/04/14   Benetta Spar L Jazira Maloney, PA-C   BP 95/55 mmHg  Pulse 97  Temp(Src) 99 F (37.2 C) (Oral)  Resp 19  Ht  (1.626 m)  Wt 204 lb (92.534 kg)  BMI 35.00 kg/m2  SpO2 97% Physical Exam  Constitutional: She appears well-developed and well-nourished. No distress.  HENT:  Head:  Normocephalic and atraumatic.  Nose: Right sinus exhibits no maxillary sinus tenderness and no frontal sinus tenderness. Left sinus exhibits no maxillary sinus tenderness and no frontal sinus tenderness.  Mouth/Throat: Mucous membranes are normal. Oropharyngeal exudate, posterior oropharyngeal edema and posterior oropharyngeal erythema present.  No trismus or uvula deviation. Normal dentition. No swelling of tongue or swelling under tongue or tenderness. No facial swelling. No unilateral swelling of oropharynx or tonsils.  Eyes: Conjunctivae and EOM are normal. Right eye exhibits no discharge. Left eye exhibits no discharge.  Neck: Normal range of motion. Neck supple.  Cardiovascular: Normal rate, regular rhythm and normal heart sounds.   Pulmonary/Chest: Effort normal and breath sounds normal. No respiratory distress. She has no wheezes. She has no rales.  Abdominal: Soft. Bowel sounds are normal. She exhibits no distension. There is no tenderness.  Lymphadenopathy:    She has cervical adenopathy.  Neurological: She is alert.  Skin: Skin is warm and dry. She is not diaphoretic.  Nursing note and vitals reviewed.   ED Course  Procedures (including critical care time) Labs Review Labs Reviewed  CBC WITH DIFFERENTIAL - Abnormal; Notable for the following:    WBC 11.8 (*)    Neutrophils Relative % 82 (*)    Neutro Abs 9.6 (*)    Lymphocytes Relative 10 (*)    All other components within normal limits  COMPREHENSIVE METABOLIC PANEL - Abnormal; Notable for the following:  Glucose, Bld 129 (*)    GFR calc non Af Amer 77 (*)    GFR calc Af Amer 89 (*)    All other components within normal limits  I-STAT CG4 LACTIC ACID, ED - Abnormal; Notable for the following:    Lactic Acid, Venous 2.44 (*)    All other components within normal limits  RAPID STREP SCREEN  CULTURE, GROUP A STREP    Imaging Review No results found.   EKG Interpretation None      MDM   Final diagnoses:   Acute bacterial pharyngitis   Pt febrile with tonsillar exudate, cervical lymphadenopathy, & dysphagia; diagnosis of strep. Treated in the Ed with steroids, NSAIDs,  and PCN IM.  Pt appears mildly dehydrated, discussed importance of water rehydration. Presentation non concerning for PTA or infxn spread to soft tissue. No trismus or uvula deviation. Discussed possibility of CT scanning for further imaging. Patient refusing. Will give one week course of Augmentin. Specific return precautions discussed. Pt able to drink water in ED without difficulty with intact air way. Recommended PCP/return to ED for follow up.   Discussed return precautions with patient. Discussed all results and patient verbalizes understanding and agrees with plan.  This is a shared patient. This patient was discussed with the physician, Dr. Littie DeedsGentry who saw and evaluated the patient and agrees with the plan.    Chloe SjogrenVictoria L Jair Lindblad, PA-C 04/04/14 2225  Mirian MoMatthew Gentry, MD 04/05/14 [phone]

## 2014-04-04 NOTE — ED Notes (Signed)
PO challenge started. Pt still c/o pain while swallow food or drinks.

## 2014-04-04 NOTE — ED Notes (Signed)
Pt c/o sore throat x 3 days with hx of strep throat. Pus filled areas and redness noted around tonsils. Reports painful to swallow. Pt noted to be spitting in a cup; able to swallow tylenol without difficulty.

## 2014-04-04 NOTE — ED Notes (Signed)
Pt in c/o sore throat for the last three days, no distress noted

## 2014-04-04 NOTE — Discharge Instructions (Signed)
Return to the emergency room with worsening of symptoms, new symptoms or with symptoms that are concerning, fevers not controlled with Tylenol or ibuprofen, shortness of breath, chest pain, difficulty breathing, drooling, severe pain, unable to swallow. Please take all of your antibiotics until finished!   You may develop abdominal discomfort or diarrhea from the antibiotic.  You may help offset this with probiotics which you can buy or get in yogurt. Do not eat  or take the probiotics until 2 hours after your antibiotic.  Repeat below information and follow all recommendations. Use salt water gargles. Salt Water Gargle This solution will help make your mouth and throat feel better. HOME CARE INSTRUCTIONS   Mix 1 teaspoon of salt in 8 ounces of warm water.  Gargle with this solution as much or often as you need or as directed. Swish and gargle gently if you have any sores or wounds in your mouth.  Do not swallow this mixture. Document Released: 12/11/2003 Document Revised: 05/31/2011 Document Reviewed: 05/03/2008 [facility] Patient Information 2015 [place], [place]. This information is not intended to replace advice given to you by your health care provider. Make sure you discuss any questions you have with your health care provider.    Pharyngitis Pharyngitis is redness, pain, and swelling (inflammation) of your pharynx.  CAUSES  Pharyngitis is usually caused by infection. Most of the time, these infections are from viruses (viral) and are part of a cold. However, sometimes pharyngitis is caused by bacteria (bacterial). Pharyngitis can also be caused by allergies. Viral pharyngitis may be spread from person to person by coughing, sneezing, and personal items or utensils (cups, forks, spoons, toothbrushes). Bacterial pharyngitis may be spread from person to person by more intimate contact, such as kissing.  SIGNS AND SYMPTOMS  Symptoms of pharyngitis include:   Sore throat.   Tiredness  (fatigue).   Low-grade fever.   Headache.  Joint pain and muscle aches.  Skin rashes.  Swollen lymph nodes.  Plaque-like film on throat or tonsils (often seen with bacterial pharyngitis). DIAGNOSIS  Your health care provider will ask you questions about your illness and your symptoms. Your medical history, along with a physical exam, is often all that is needed to diagnose pharyngitis. Sometimes, a rapid strep test is done. Other lab tests may also be done, depending on the suspected cause.  TREATMENT  Viral pharyngitis will usually get better in 3-4 days without the use of medicine. Bacterial pharyngitis is treated with medicines that kill germs (antibiotics).  HOME CARE INSTRUCTIONS   Drink enough water and fluids to keep your urine clear or pale yellow.   Only take over-the-counter or prescription medicines as directed by your health care provider:   If you are prescribed antibiotics, make sure you finish them even if you start to feel better.   Do not take aspirin.   Get lots of rest.   Gargle with 8 oz of salt water ( tsp of salt per 1 qt of water) as often as every 1-2 hours to soothe your throat.   Throat lozenges (if you are not at risk for choking) or sprays may be used to soothe your throat. SEEK MEDICAL CARE IF:   You have large, tender lumps in your neck.  You have a rash.  You cough up green, yellow-brown, or bloody spit. SEEK IMMEDIATE MEDICAL CARE IF:   Your neck becomes stiff.  You drool or are unable to swallow liquids.  You vomit or are unable to keep medicines or  liquids down.  You have severe pain that does not go away with the use of recommended medicines.  You have trouble breathing (not caused by a stuffy nose). MAKE SURE YOU:   Understand these instructions.  Will watch your condition.  Will get help right away if you are not doing well or get worse. Document Released: 03/08/2005 Document Revised: 12/27/2012 Document Reviewed:  11/13/2012 [facility] Patient Information 2015 [place], [place]. This information is not intended to replace advice given to you by your health care provider. Make sure you discuss any questions you have with your health care provider.

## 2014-04-04 NOTE — ED Notes (Signed)
Tori PA at bedside

## 2014-04-06 LAB — CULTURE, GROUP A STREP

## 2019-01-06 ENCOUNTER — Emergency Department (HOSPITAL_COMMUNITY): Payer: Medicaid Other

## 2019-01-06 ENCOUNTER — Emergency Department (HOSPITAL_COMMUNITY)
Admission: EM | Admit: 2019-01-06 | Discharge: 2019-01-06 | Disposition: A | Discharge status: Home / Self Care | Payer: Medicaid Other | Attending: Emergency Medicine | Admitting: Emergency Medicine

## 2019-01-06 ENCOUNTER — Other Ambulatory Visit: Payer: Self-pay

## 2019-01-06 DIAGNOSIS — Z20828 Contact with and (suspected) exposure to other viral communicable diseases: Secondary | ICD-10-CM | POA: Insufficient documentation

## 2019-01-06 DIAGNOSIS — J40 Bronchitis, not specified as acute or chronic: Secondary | ICD-10-CM | POA: Insufficient documentation

## 2019-01-06 DIAGNOSIS — R0602 Shortness of breath: Secondary | ICD-10-CM | POA: Diagnosis present

## 2019-01-06 MED ORDER — ALBUTEROL SULFATE HFA 108 (90 BASE) MCG/ACT IN AERS
4.0000 | INHALATION_SPRAY | Freq: Once | RESPIRATORY_TRACT | Status: AC
  Administered 2019-01-06: 06:00:00 4 via RESPIRATORY_TRACT
  Filled 2019-01-06: qty 6.7

## 2019-01-06 NOTE — ED Provider Notes (Signed)
[facility] EMERGENCY DEPARTMENT Provider Note  CSN: [id number] Arrival date & time: 01/06/19 0056  Chief Complaint(s) Shortness of Breath  HPI Chloe Black is a 33 y.o. female smoker who presents with 2 days of gradually worsening shortness of breath and nonproductive cough.  Denies any fevers or chills.  No nasal congestion.  No chest pain.  No nausea or vomiting.  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  She did report no sick contacts from various other sources.  HPI  Past Medical History Past Medical History:  Diagnosis Date   Abnormal Pap smear    Patient Active Problem List   Diagnosis Date Noted   LGSIL (low grade squamous intraepithelial lesion) on Pap smear 10/09/2012   Home Medication(s) Prior to Admission medications   Medication Sig Start Date End Date Taking? Authorizing Provider  amoxicillin-clavulanate (AUGMENTIN) 875-125 MG per tablet Take 1 tablet by mouth every 12 (twelve) hours. 04/04/14   Al Corpus, PA-C  levonorgestrel-ethinyl estradiol (NORDETTE) 0.15-30 MG-MCG tablet Take 1 tablet by mouth daily.    [provider]                                                                                                                                    Past Surgical History No past surgical history on file. Family History No family history on file.  Social History Social History   Tobacco Use   Smoking status: Never Smoker  Substance Use Topics   Alcohol use: No   Drug use: No   Allergies Patient has no known allergies.  Review of Systems Review of Systems All other systems are reviewed and are negative for acute change except as noted in the HPI  Physical Exam Vital Signs  I have reviewed the triage vital signs BP 132/90    Pulse 69    Temp 98.3 F (36.8 C) (Oral)    Resp 16    LMP 12/31/2018 (Approximate)     SpO2 97%   Physical Exam Vitals signs reviewed.  Constitutional:      General: She is not in acute distress.    Appearance: She is well-developed. She is not diaphoretic.  HENT:     Head: Normocephalic and atraumatic.     Nose: Nose normal.  Eyes:     General: No scleral icterus.       Right eye: No discharge.        Left eye: No discharge.     Conjunctiva/sclera: Conjunctivae normal.     Pupils: Pupils are equal, round, and reactive to light.  Neck:     Musculoskeletal: Normal range of motion and neck supple.  Cardiovascular:     Rate and Rhythm: Normal rate and regular rhythm.     Heart sounds: No murmur. No friction rub.  No gallop.   Pulmonary:     Effort: Pulmonary effort is normal. No respiratory distress.     Breath sounds: No stridor. Wheezing (faint exp wheezing throughout) present. No rales.  Abdominal:     General: There is no distension.     Palpations: Abdomen is soft.     Tenderness: There is no abdominal tenderness.  Musculoskeletal:        General: No tenderness.  Skin:    General: Skin is warm and dry.     Findings: No erythema or rash.  Neurological:     Mental Status: She is alert and oriented to person, place, and time.     ED Results and Treatments Labs (all labs ordered are listed, but only abnormal results are displayed) Labs Reviewed  NOVEL CORONAVIRUS, NAA (HOSP ORDER, SEND-OUT TO REF LAB; TAT 18-24 HRS)                                                                                                                         EKG  EKG Interpretation  Date/Time:  Saturday January 06 2019 01:17:57 EDT Ventricular Rate:  108 PR Interval:  160 QRS Duration: 64 QT Interval:  318 QTC Calculation: 426 R Axis:   86 Text Interpretation:  Sinus tachycardia Otherwise normal ECG No old tracing to compare Confirmed by Drema Pry [phone]) on 01/06/2019 4:44:39 AM      Radiology Dg Chest 2 View  Result Date: 01/06/2019 CLINICAL DATA:  Shortness  of breath. Cough. EXAM: CHEST - 2 VIEW COMPARISON:  None. FINDINGS: The cardiomediastinal contours are normal. The lungs are clear. Pulmonary vasculature is normal. No consolidation, pleural effusion, or pneumothorax. No acute osseous abnormalities are seen. IMPRESSION: Unremarkable radiographs of the chest. Electronically Signed   By: Narda Rutherford M.Chloe.   On: 01/06/2019 01:48    Pertinent labs & imaging results that were available during my care of the patient were reviewed by me and considered in my medical decision making (see chart for details).  Medications Ordered in ED Medications  albuterol (VENTOLIN HFA) 108 (90 Base) MCG/ACT inhaler 4 puff (4 puffs Inhalation Given 01/06/19 0556)                                                                                                                                    Procedures Procedures  (including critical care time)  Medical Decision Making / ED  Course I have reviewed the nursing notes for this encounter and the patient's prior records (if available in EHR or on provided paperwork).   Devi Lorna DibbleM Black was evaluated in Emergency Department on 01/06/2019 for the symptoms described in the history of present illness. She was evaluated in the context of the global COVID-19 pandemic, which necessitated consideration that the patient might be at risk for infection with the SARS-CoV-2 virus that causes COVID-19. Institutional protocols and algorithms that pertain to the evaluation of patients at risk for COVID-19 are in a state of rapid change based on information released by regulatory bodies including the CDC and federal and state organizations. These policies and algorithms were followed during the patient's care in the ED.  Patient presents with viral symptoms for 2 days. adequate oral hydration. Rest of history as above.  Patient appears well. No signs of toxicity, patient is interactive. No hypoxia, tachypnea or other signs of  respiratory distress. No sign of clinical dehydration. Lung exam with faint wheezing. Rest of exam as above.  Chest x-ray without pneumonia.  Provided with albuterol inhaler resulting in improved shortness of breath and wheezing.  Most consistent with viral illness   No evidence suggestive of pharyngitis, AOM, PNA.  Discussed symptomatic treatment with the patient and they will follow closely with their PCP.        Final Clinical Impression(s) / ED Diagnoses Final diagnoses:  Bronchitis    The patient appears reasonably screened and/or stabilized for discharge and I doubt any other medical condition or other [facility] requiring further screening, evaluation, or treatment in the ED at this time prior to discharge.  Disposition: Discharge  Condition: Good  I have discussed the results, Dx and Tx plan with the patient who expressed understanding and agree(s) with the plan. Discharge instructions discussed at great length. The patient was given strict return precautions who verbalized understanding of the instructions. No further questions at time of discharge.    ED Discharge Orders    None        Follow Up: Primary care provider  Schedule an appointment as soon as possible for a visit        This chart was dictated using voice recognition software.  Despite best efforts to proofread,  errors can occur which can change the documentation meaning.   Nira Connardama, Eleno Weimar Eduardo, MD 01/06/19 [phone]

## 2019-01-06 NOTE — ED Triage Notes (Signed)
Pt c/o SOB x2 days. Also c/o non-productive cough. Pt is audibly congested.

## 2019-01-07 LAB — NOVEL CORONAVIRUS, NAA (HOSP ORDER, SEND-OUT TO REF LAB; TAT 18-24 HRS): SARS-CoV-2, NAA: NOT DETECTED

## 2019-11-10 DIAGNOSIS — Z20822 Contact with and (suspected) exposure to covid-19: Secondary | ICD-10-CM | POA: Diagnosis not present

## 2020-02-07 IMAGING — CR DG CHEST 2V
2 series · 2 of 2 positions shown · non-contrast
Comparison: None.

CLINICAL DATA: Shortness of breath. Cough.

EXAM:
CHEST - 2 VIEW

[chest pa]
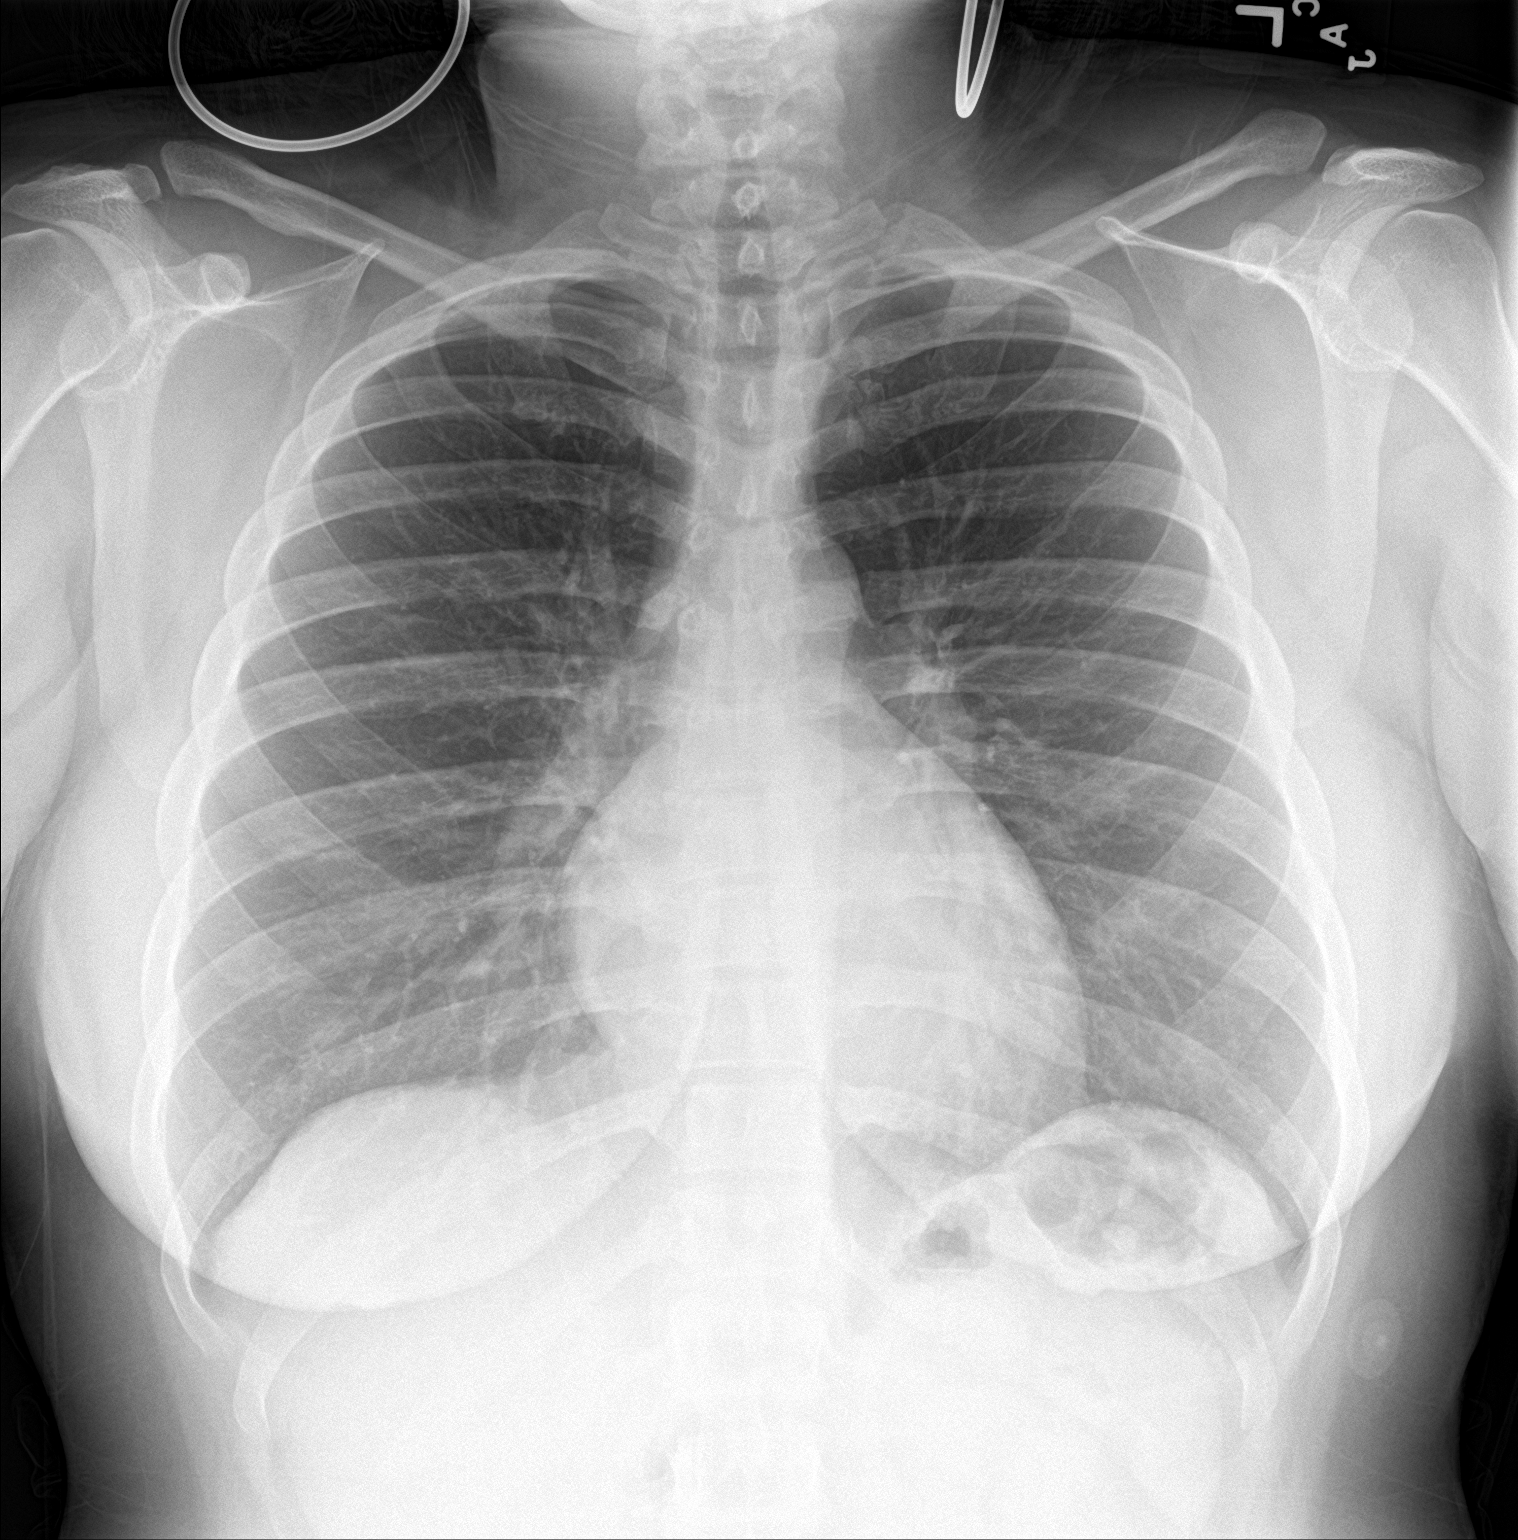

[chest lat]
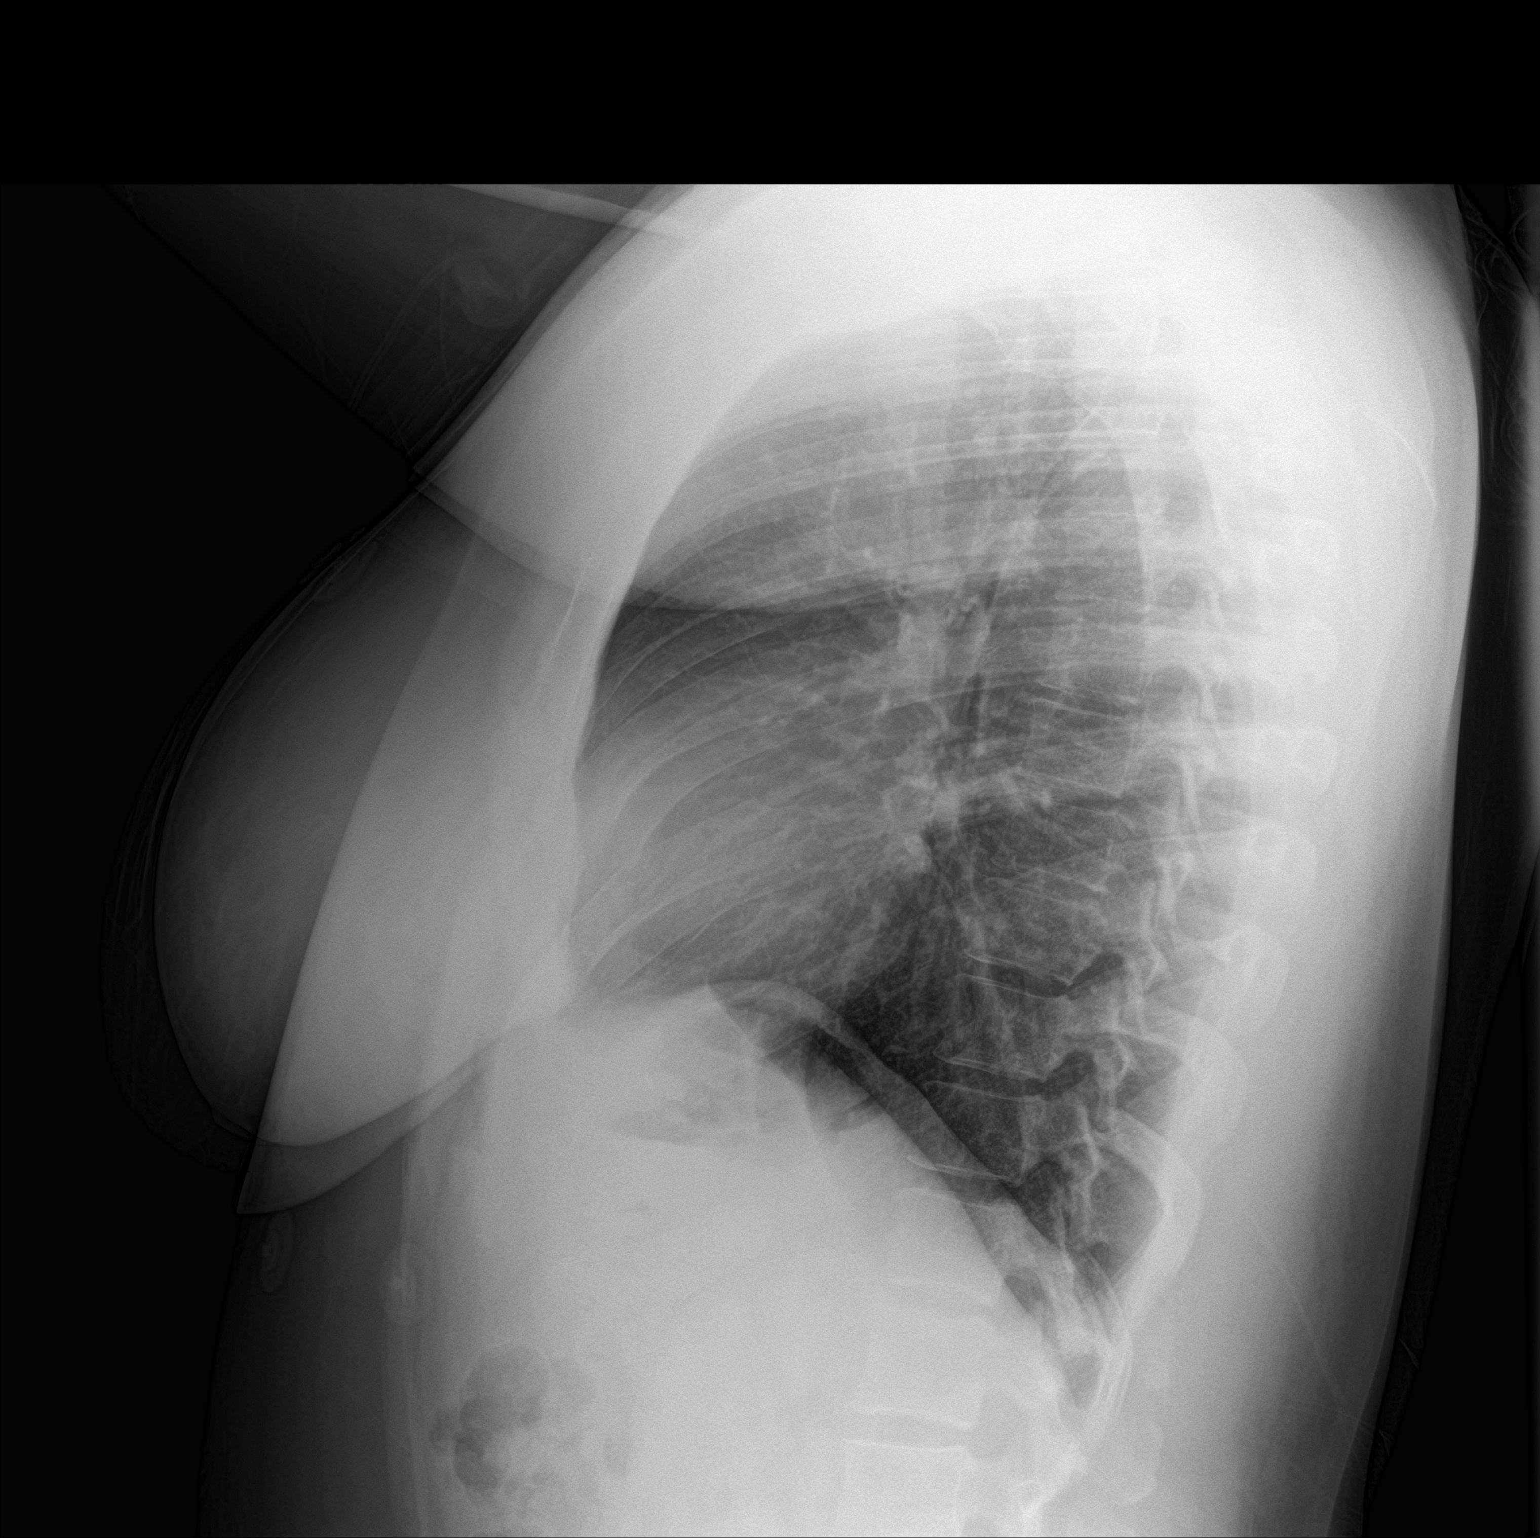

[2 of 2 positions shown; findings below may reference images not displayed]

FINDINGS: The cardiomediastinal contours are normal. The lungs are clear.
Pulmonary vasculature is normal. No consolidation, pleural effusion,
or pneumothorax. No acute osseous abnormalities are seen.
IMPRESSION: Unremarkable radiographs of the chest.

## 2020-05-01 ENCOUNTER — Other Ambulatory Visit: Payer: Self-pay

## 2021-01-20 DIAGNOSIS — Z419 Encounter for procedure for purposes other than remedying health state, unspecified: Secondary | ICD-10-CM | POA: Diagnosis not present

## 2021-02-19 DIAGNOSIS — Z419 Encounter for procedure for purposes other than remedying health state, unspecified: Secondary | ICD-10-CM | POA: Diagnosis not present

## 2021-03-22 DIAGNOSIS — Z419 Encounter for procedure for purposes other than remedying health state, unspecified: Secondary | ICD-10-CM | POA: Diagnosis not present

## 2021-04-22 DIAGNOSIS — Z419 Encounter for procedure for purposes other than remedying health state, unspecified: Secondary | ICD-10-CM | POA: Diagnosis not present

## 2021-05-20 DIAGNOSIS — Z419 Encounter for procedure for purposes other than remedying health state, unspecified: Secondary | ICD-10-CM | POA: Diagnosis not present

## 2021-06-20 DIAGNOSIS — Z419 Encounter for procedure for purposes other than remedying health state, unspecified: Secondary | ICD-10-CM | POA: Diagnosis not present

## 2021-07-20 DIAGNOSIS — Z419 Encounter for procedure for purposes other than remedying health state, unspecified: Secondary | ICD-10-CM | POA: Diagnosis not present

## 2021-08-20 DIAGNOSIS — Z419 Encounter for procedure for purposes other than remedying health state, unspecified: Secondary | ICD-10-CM | POA: Diagnosis not present

## 2021-09-19 DIAGNOSIS — Z419 Encounter for procedure for purposes other than remedying health state, unspecified: Secondary | ICD-10-CM | POA: Diagnosis not present

## 2021-10-20 DIAGNOSIS — Z419 Encounter for procedure for purposes other than remedying health state, unspecified: Secondary | ICD-10-CM | POA: Diagnosis not present

## 2021-11-20 DIAGNOSIS — Z419 Encounter for procedure for purposes other than remedying health state, unspecified: Secondary | ICD-10-CM | POA: Diagnosis not present

## 2021-12-20 DIAGNOSIS — Z419 Encounter for procedure for purposes other than remedying health state, unspecified: Secondary | ICD-10-CM | POA: Diagnosis not present

## 2022-01-20 DIAGNOSIS — Z419 Encounter for procedure for purposes other than remedying health state, unspecified: Secondary | ICD-10-CM | POA: Diagnosis not present

## 2022-02-19 DIAGNOSIS — Z419 Encounter for procedure for purposes other than remedying health state, unspecified: Secondary | ICD-10-CM | POA: Diagnosis not present

## 2022-03-22 DIAGNOSIS — Z419 Encounter for procedure for purposes other than remedying health state, unspecified: Secondary | ICD-10-CM | POA: Diagnosis not present

## 2022-04-22 DIAGNOSIS — Z419 Encounter for procedure for purposes other than remedying health state, unspecified: Secondary | ICD-10-CM | POA: Diagnosis not present

## 2022-05-21 DIAGNOSIS — Z419 Encounter for procedure for purposes other than remedying health state, unspecified: Secondary | ICD-10-CM | POA: Diagnosis not present

## 2022-06-21 DIAGNOSIS — Z419 Encounter for procedure for purposes other than remedying health state, unspecified: Secondary | ICD-10-CM | POA: Diagnosis not present

## 2022-07-21 DIAGNOSIS — Z419 Encounter for procedure for purposes other than remedying health state, unspecified: Secondary | ICD-10-CM | POA: Diagnosis not present

## 2022-08-21 DIAGNOSIS — Z419 Encounter for procedure for purposes other than remedying health state, unspecified: Secondary | ICD-10-CM | POA: Diagnosis not present

## 2022-09-20 DIAGNOSIS — Z419 Encounter for procedure for purposes other than remedying health state, unspecified: Secondary | ICD-10-CM | POA: Diagnosis not present

## 2022-10-21 DIAGNOSIS — Z419 Encounter for procedure for purposes other than remedying health state, unspecified: Secondary | ICD-10-CM | POA: Diagnosis not present

## 2022-11-21 DIAGNOSIS — Z419 Encounter for procedure for purposes other than remedying health state, unspecified: Secondary | ICD-10-CM | POA: Diagnosis not present

## 2022-12-21 DIAGNOSIS — Z419 Encounter for procedure for purposes other than remedying health state, unspecified: Secondary | ICD-10-CM | POA: Diagnosis not present

## 2023-01-21 DIAGNOSIS — Z419 Encounter for procedure for purposes other than remedying health state, unspecified: Secondary | ICD-10-CM | POA: Diagnosis not present

## 2023-02-20 DIAGNOSIS — Z419 Encounter for procedure for purposes other than remedying health state, unspecified: Secondary | ICD-10-CM | POA: Diagnosis not present

## 2023-03-23 DIAGNOSIS — Z419 Encounter for procedure for purposes other than remedying health state, unspecified: Secondary | ICD-10-CM | POA: Diagnosis not present

## 2023-04-23 DIAGNOSIS — Z419 Encounter for procedure for purposes other than remedying health state, unspecified: Secondary | ICD-10-CM | POA: Diagnosis not present

## 2023-05-09 DIAGNOSIS — Z23 Encounter for immunization: Secondary | ICD-10-CM | POA: Diagnosis not present

## 2023-05-09 DIAGNOSIS — Z111 Encounter for screening for respiratory tuberculosis: Secondary | ICD-10-CM | POA: Diagnosis not present

## 2023-05-21 DIAGNOSIS — Z419 Encounter for procedure for purposes other than remedying health state, unspecified: Secondary | ICD-10-CM | POA: Diagnosis not present

## 2023-07-02 DIAGNOSIS — Z419 Encounter for procedure for purposes other than remedying health state, unspecified: Secondary | ICD-10-CM | POA: Diagnosis not present

## 2023-08-01 DIAGNOSIS — Z419 Encounter for procedure for purposes other than remedying health state, unspecified: Secondary | ICD-10-CM | POA: Diagnosis not present

## 2023-09-01 DIAGNOSIS — Z419 Encounter for procedure for purposes other than remedying health state, unspecified: Secondary | ICD-10-CM | POA: Diagnosis not present

## 2023-10-01 DIAGNOSIS — Z419 Encounter for procedure for purposes other than remedying health state, unspecified: Secondary | ICD-10-CM | POA: Diagnosis not present

## 2023-11-01 DIAGNOSIS — Z419 Encounter for procedure for purposes other than remedying health state, unspecified: Secondary | ICD-10-CM | POA: Diagnosis not present

## 2023-12-02 DIAGNOSIS — Z419 Encounter for procedure for purposes other than remedying health state, unspecified: Secondary | ICD-10-CM | POA: Diagnosis not present

## 2024-02-01 DIAGNOSIS — Z419 Encounter for procedure for purposes other than remedying health state, unspecified: Secondary | ICD-10-CM | POA: Diagnosis not present

## 2024-03-02 DIAGNOSIS — Z419 Encounter for procedure for purposes other than remedying health state, unspecified: Secondary | ICD-10-CM | POA: Diagnosis not present
